# Patient Record
Sex: Male | Born: 1960 | Race: Black or African American | Hispanic: No | Smoking: Current every day smoker
Health system: Southern US, Community
[De-identification: ages and names within clinical notes are randomized; demographics above are authoritative.]

## PROBLEM LIST (undated history)

## (undated) DIAGNOSIS — M1990 Unspecified osteoarthritis, unspecified site: Secondary | ICD-10-CM

## (undated) HISTORY — PX: SPINE SURGERY: SHX786

## (undated) HISTORY — DX: Unspecified osteoarthritis, unspecified site: M19.90

---

## 2002-10-06 ENCOUNTER — Encounter: Payer: Self-pay | Admitting: Emergency Medicine

## 2002-10-06 ENCOUNTER — Inpatient Hospital Stay (HOSPITAL_COMMUNITY): Admission: EM | Admit: 2002-10-06 | Discharge: 2002-10-08 | Payer: Self-pay | Admitting: Emergency Medicine

## 2002-10-10 ENCOUNTER — Encounter (HOSPITAL_BASED_OUTPATIENT_CLINIC_OR_DEPARTMENT_OTHER): Admission: RE | Admit: 2002-10-10 | Discharge: 2002-11-05 | Payer: Self-pay | Admitting: *Deleted

## 2003-04-21 ENCOUNTER — Encounter: Payer: Self-pay | Admitting: Emergency Medicine

## 2003-04-21 ENCOUNTER — Inpatient Hospital Stay (HOSPITAL_COMMUNITY): Admission: AC | Admit: 2003-04-21 | Discharge: 2003-05-10 | Payer: Self-pay

## 2003-05-09 ENCOUNTER — Encounter: Payer: Self-pay | Admitting: Surgery

## 2003-05-10 ENCOUNTER — Inpatient Hospital Stay (HOSPITAL_COMMUNITY)
Admission: RE | Admit: 2003-05-10 | Discharge: 2003-05-24 | Payer: Self-pay | Admitting: Physical Medicine & Rehabilitation

## 2003-05-10 ENCOUNTER — Encounter: Payer: Self-pay | Admitting: General Surgery

## 2003-05-15 ENCOUNTER — Encounter: Payer: Self-pay | Admitting: Physical Medicine & Rehabilitation

## 2003-06-18 ENCOUNTER — Encounter
Admission: RE | Admit: 2003-06-18 | Discharge: 2003-09-16 | Payer: Self-pay | Admitting: Physical Medicine & Rehabilitation

## 2003-06-28 ENCOUNTER — Ambulatory Visit (HOSPITAL_COMMUNITY)
Admission: RE | Admit: 2003-06-28 | Discharge: 2003-06-28 | Payer: Self-pay | Admitting: Physical Medicine & Rehabilitation

## 2003-09-03 ENCOUNTER — Encounter
Admission: RE | Admit: 2003-09-03 | Discharge: 2003-11-19 | Payer: Self-pay | Admitting: Physical Medicine & Rehabilitation

## 2003-09-27 ENCOUNTER — Encounter
Admission: RE | Admit: 2003-09-27 | Discharge: 2003-12-26 | Payer: Self-pay | Admitting: Physical Medicine & Rehabilitation

## 2007-05-26 ENCOUNTER — Encounter (INDEPENDENT_AMBULATORY_CARE_PROVIDER_SITE_OTHER): Payer: Self-pay | Admitting: Nurse Practitioner

## 2007-05-26 ENCOUNTER — Ambulatory Visit: Payer: Self-pay | Admitting: Internal Medicine

## 2007-05-26 LAB — CONVERTED CEMR LAB
ALT: 19 units/L (ref 0–53)
AST: 16 units/L (ref 0–37)
Albumin: 4.5 g/dL (ref 3.5–5.2)
Alkaline Phosphatase: 59 units/L (ref 39–117)
BUN: 12 mg/dL (ref 6–23)
Basophils Absolute: 0 10*3/uL (ref 0.0–0.1)
Basophils Relative: 0 % (ref 0–1)
CO2: 25 meq/L (ref 19–32)
Calcium: 9.9 mg/dL (ref 8.4–10.5)
Chloride: 105 meq/L (ref 96–112)
Creatinine, Ser: 1.21 mg/dL (ref 0.40–1.50)
Eosinophils Absolute: 0 10*3/uL (ref 0.0–0.7)
Eosinophils Relative: 0 % (ref 0–5)
Glucose, Bld: 80 mg/dL (ref 70–99)
HCT: 48.6 % (ref 39.0–52.0)
Hemoglobin: 15.2 g/dL (ref 13.0–17.0)
Lymphocytes Relative: 19 % (ref 12–46)
Lymphs Abs: 1.9 10*3/uL (ref 0.7–3.3)
MCHC: 31.3 g/dL (ref 30.0–36.0)
MCV: 94.2 fL (ref 78.0–100.0)
Monocytes Absolute: 0.6 10*3/uL (ref 0.2–0.7)
Monocytes Relative: 6 % (ref 3–11)
Neutro Abs: 7.5 10*3/uL (ref 1.7–7.7)
Neutrophils Relative %: 75 % (ref 43–77)
PSA: 1.38 ng/mL (ref 0.10–4.00)
Platelets: 339 10*3/uL (ref 150–400)
Potassium: 4.5 meq/L (ref 3.5–5.3)
RBC: 5.16 M/uL (ref 4.22–5.81)
RDW: 14.7 % — ABNORMAL HIGH (ref 11.5–14.0)
Sodium: 143 meq/L (ref 135–145)
TSH: 1.455 microintl units/mL (ref 0.350–5.50)
Total Bilirubin: 0.6 mg/dL (ref 0.3–1.2)
Total Protein: 7.6 g/dL (ref 6.0–8.3)
WBC: 10 10*3/uL (ref 4.0–10.5)

## 2007-05-29 ENCOUNTER — Encounter (INDEPENDENT_AMBULATORY_CARE_PROVIDER_SITE_OTHER): Payer: Self-pay | Admitting: Nurse Practitioner

## 2007-05-29 ENCOUNTER — Ambulatory Visit: Payer: Self-pay | Admitting: Family Medicine

## 2007-05-29 LAB — CONVERTED CEMR LAB
Cholesterol: 244 mg/dL — ABNORMAL HIGH (ref 0–200)
HDL: 57 mg/dL (ref >39)
LDL Cholesterol: 165 mg/dL — ABNORMAL HIGH (ref 0–99)
Total CHOL/HDL Ratio: 4.3
Triglycerides: 111 mg/dL (ref <150)
VLDL: 22 mg/dL (ref 0–40)

## 2007-05-30 ENCOUNTER — Ambulatory Visit (HOSPITAL_COMMUNITY): Admission: RE | Admit: 2007-05-30 | Discharge: 2007-05-30 | Payer: Self-pay | Admitting: Family Medicine

## 2008-04-09 ENCOUNTER — Ambulatory Visit: Payer: Self-pay | Admitting: Family Medicine

## 2008-04-12 ENCOUNTER — Ambulatory Visit: Payer: Self-pay | Admitting: *Deleted
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
## 2016-02-19 ENCOUNTER — Encounter: Payer: Self-pay | Admitting: Occupational Therapy

## 2016-02-19 ENCOUNTER — Ambulatory Visit: Payer: Non-veteran care | Attending: Internal Medicine | Admitting: Occupational Therapy

## 2016-02-19 DIAGNOSIS — S069XAA Unspecified intracranial injury with loss of consciousness status unknown, initial encounter: Secondary | ICD-10-CM | POA: Insufficient documentation

## 2016-02-19 DIAGNOSIS — M25641 Stiffness of right hand, not elsewhere classified: Secondary | ICD-10-CM | POA: Diagnosis present

## 2016-02-19 DIAGNOSIS — M6281 Muscle weakness (generalized): Secondary | ICD-10-CM | POA: Insufficient documentation

## 2016-02-19 DIAGNOSIS — R600 Localized edema: Secondary | ICD-10-CM | POA: Diagnosis present

## 2016-02-19 DIAGNOSIS — M25541 Pain in joints of right hand: Secondary | ICD-10-CM | POA: Insufficient documentation

## 2016-02-19 DIAGNOSIS — M1990 Unspecified osteoarthritis, unspecified site: Secondary | ICD-10-CM | POA: Insufficient documentation

## 2016-02-19 DIAGNOSIS — S069X9A Unspecified intracranial injury with loss of consciousness of unspecified duration, initial encounter: Secondary | ICD-10-CM | POA: Insufficient documentation

## 2016-02-19 NOTE — Therapy (Signed)
[facility] [address] [place], [place], [address] Phone: [phone]   Fax:  [phone]  Occupational Therapy Evaluation  Patient Details  Name: Jesse Galloway MRN: [id number] Date of Birth: May 25, 1961 Referring Provider: Dr. Glenford Peers  Encounter Date: 02/19/2016      OT End of Session - 02/19/16 1140    Visit Number 1   Number of Visits 13   Date for OT Re-Evaluation 04/02/16   Authorization Type VA   Authorization Time Period APPROVED 14 VISITS THRU 04/24/16   Authorization - Visit Number 1   Authorization - Number of Visits 14   OT Start Time 1015   OT Stop Time 1100   OT Time Calculation (min) 45 min   Activity Tolerance Patient tolerated treatment well      Past Medical History:  Diagnosis Date  . Arthritis     Past Surgical History:  Procedure Laterality Date  . SPINE SURGERY      There were no vitals filed for this visit.      Subjective Assessment - 02/19/16 1023    Subjective  It's stiff and painful (re: Rt long finger)    Pertinent History Recent surgery to Rt long finger 11/17/15, PMH: OA, TBI, back surgery for tumor removal    Limitations finger healed, no restrictions at this time (wears finger extension splint for pm)   Patient Stated Goals I want to get my finger straighter and less painful   Currently in Pain? Yes   Pain Score 5    Pain Location Finger (Comment which one)  long   Pain Orientation Right   Pain Descriptors / Indicators Throbbing   Pain Type Chronic pain   Pain Onset More than a month ago   Pain Frequency Intermittent   Aggravating Factors  early am   Pain Relieving Factors pain meds, exercise, heat           [facility] OT Assessment - 02/19/16 0001      Assessment   Diagnosis flexor tendon injury (over 30 yrs ago), s/p capsulectomy and contracture release Rt long finger   Referring Provider Dr. Glenford Peers   Onset Date 11/17/15  surgery date  (original injury > 30 yrs ago)   Prior Therapy 2 visits of OT at [facility] hospital      Precautions   Precautions None   Required Braces or Orthoses Other Brace/Splint   Other Brace/Splint Pt has PM finger splint for PIP extension (made at [facility])     Restrictions   Weight Bearing Restrictions No     Balance Screen   Has the patient fallen in the past 6 months Yes   How many times? 2   Has the patient had a decrease in activity level because of a fear of falling?  No   Is the patient reluctant to leave their home because of a fear of falling?  No     Home  Environment   Bathroom Shower/Tub Walk-in Shower;Curtain  built in seat    Additional Comments Pt lives in 3 story home (counting basement level) but pt lives on main floor. Goes to basement 1x/wk for laundry. Pt has 2 steps to enter home. 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   ADL comments Pt does not drive     IADL   Shopping Assistance for transportation   Light Housekeeping Does personal laundry completely;Performs light daily tasks such as dishwashing, bed making   Meal Prep Plans, prepares and serves adequate meals independently   Medication Management Has difficulty remembering to take medication   Financial Management --  Brother pays all bills     Mobility   Mobility Status Comments uses [facility]     Written Expression   Dominant Hand Right   Handwriting 100% legible  In print, no changes in signature     Vision - History   Baseline Vision  Wears glasses all the time     Cognition   Overall Cognitive Status History of cognitive impairments - at baseline  TBI approx 8-10 years ago     Observation/Other Assessments   Observations incision healed     Sensation   Light Touch Impaired Detail   Additional Comments numb along incision volarly Rt long finger     Coordination   9 Hole Peg Test Right;Left   Right 9 Hole Peg Test 24.97 sec   Left 9 Hole Peg Test 22.06 sec     Edema   Edema moderate Rt long finger and at PIP joint     ROM / Strength   AROM / PROM / Strength AROM     AROM   Overall AROM Comments BUE AROM WNL's except Rt long finger PIP joint. All other digits including thumb WFL's.      Right Hand AROM   R Long  MCP 0-90 80 Degrees  0 ext   R Long PIP 0-100 95 Degrees  extensor lag -50*     Hand Function   Right Hand Grip (lbs) 58 lbs   Left Hand Grip (lbs) 90 lbs                           OT Short Term Goals - 02/19/16 1202      OT SHORT TERM GOAL #1   Title Independent with HEP (Due 03/12/16)   Time 3   Period Weeks   Status New     OT SHORT TERM GOAL #2   Title Pain less than or equal to 4/10 with daily activities   Baseline 5-8/10   Time 3   Period Weeks   Status New     OT SHORT TERM GOAL #3   Title Rt long finger extensor lag to improve by 10 degrees or >   Baseline -50*   Time 3   Period Weeks   Status New           OT Long Term Goals - 02/19/16 1203      OT LONG TERM GOAL #1   Title Independent with edema reduction techniques and splinting needs prn (due 04/02/16)   Time 6   Period Weeks   Status New     OT LONG TERM GOAL #2   Title Grip strength Rt hand to be 65 lbs or greater to assist with opening jars/containers   Baseline 58 lbs   Time 6   Period Weeks  Status New     OT LONG TERM GOAL #3   Title Rt long finger extensor lag to improve by 20 degrees    Baseline -50   Time 6   Period Weeks   Status New               Plan -  March 17, 2016 1150    Clinical Impression Statement Pt is a 55 y.o. male who presents to outpatient rehab s/p capsulectomy and contracture release of Rt long finger on 11/17/15 from flexor tendon injury over 30 yrs ago. Pt presents now with significant PIP extensor lag, joint/finger swelling, and pain which limits ability to perform ADLS efficiently.    Rehab Potential Fair   Clinical Impairments Affecting Rehab Potential time since original injury and time since surgery   OT Frequency 2x / week   OT Duration 6 weeks  plus evaluation   OT Treatment/Interventions Self-care/ADL training;Moist Heat;Fluidtherapy;DME and/or AE instruction;Splinting;Patient/family education;Compression bandaging;Therapeutic exercises;Scar mobilization;Therapeutic activities;Ultrasound;Cryotherapy;Passive range of motion;Electrical Stimulation;Parrafin;Manual Therapy   Plan modalities ([place], Fluido or paraffin), PROM   Consulted and Agree with Plan of Care Patient      Patient will benefit from skilled therapeutic intervention in order to improve the following deficits and impairments:  Decreased coordination, Decreased range of motion, Impaired flexibility, Increased edema, Impaired sensation, Decreased skin integrity, Impaired UE functional use, Pain, Decreased scar mobility, Decreased strength  Visit Diagnosis: Pain in joint of right hand - Plan: Ot plan of care cert/re-cert  Localized edema - Plan: Ot plan of care cert/re-cert  Stiffness of joint, hand, right - Plan: Ot plan of care cert/re-cert  Muscle weakness (generalized) - Plan: Ot plan of care cert/re-cert      G-Codes - 2016/03/17 1204-12-19    Functional Assessment Tool Used Rt long finger mod to max edema, pain 5-8/10, extensor lag PIP joint -50*   Functional Limitation Changing and maintaining body position   Changing and Maintaining Body Position Current Status (E4540) At least 40 percent but less than 60 percent impaired, limited or restricted   Changing and  Maintaining Body Position Goal Status (J8119) At least 20 percent but less than 40 percent impaired, limited or restricted      Problem List Patient Active Problem List   Diagnosis Date Noted  . Osteoarthritis 03-17-16  . TBI (traumatic brain injury) (HCC) 2016-03-17    Kelli Churn, OTR/L 2016-03-17, 12:09 PM  [facility] [address] [place], [place], [address] Phone: [phone]   Fax:  [phone]  Name: Bharath Bernstein MRN: [id number] Date of Birth: November 15, 1960

## 2016-03-02 ENCOUNTER — Ambulatory Visit: Payer: Non-veteran care | Attending: Internal Medicine | Admitting: Occupational Therapy

## 2016-03-02 DIAGNOSIS — M25541 Pain in joints of right hand: Secondary | ICD-10-CM | POA: Insufficient documentation

## 2016-03-02 DIAGNOSIS — M25641 Stiffness of right hand, not elsewhere classified: Secondary | ICD-10-CM | POA: Diagnosis present

## 2016-03-02 DIAGNOSIS — R600 Localized edema: Secondary | ICD-10-CM | POA: Diagnosis present

## 2016-03-02 DIAGNOSIS — M6281 Muscle weakness (generalized): Secondary | ICD-10-CM | POA: Insufficient documentation

## 2016-03-02 NOTE — Therapy (Signed)
[facility] [address] [place], [place], [address] Phone: [phone]   Fax:  [phone]  Occupational Therapy Treatment  Patient Details  Name: Jesse Galloway MRN: [id number] Date of Birth: 05/02/1961 Referring Provider: Dr. Glenford Peers  Encounter Date: 03/02/2016      OT End of Session - 03/02/16 0940    Visit Number 2   Number of Visits 13   Date for OT Re-Evaluation 04/02/16   Authorization Type VA   Authorization Time Period APPROVED 14 VISITS THRU 04/24/16   Authorization - Visit Number 2   Authorization - Number of Visits 14   OT Start Time 0835   OT Stop Time 0930   OT Time Calculation (min) 55 min   Activity Tolerance Patient tolerated treatment well      Past Medical History:  Diagnosis Date  . Arthritis     Past Surgical History:  Procedure Laterality Date  . SPINE SURGERY      There were no vitals filed for this visit.      Subjective Assessment - 03/02/16 0844    Subjective  Pt reports 8/10 this am, but has gone down to 5/10    Pertinent History Recent surgery to Rt long finger 11/17/15, PMH: OA, TBI, back surgery for tumor removal    Limitations finger healed, no restrictions at this time (wears finger extension splint for pm)   Patient Stated Goals I want to get my finger straighter and less painful   Currently in Pain? Yes   Pain Score 5    Pain Location --  long finger   Pain Orientation Right   Pain Descriptors / Indicators Throbbing   Pain Type Chronic pain   Pain Onset More than a month ago   Pain Frequency Intermittent   Aggravating Factors  early am   Pain Relieving Factors pain meds, exercise, heat                      OT Treatments/Exercises (OP) - 03/02/16 0001      Exercises   Exercises Hand     Hand Exercises   Other Hand Exercises Emphasized A/ROM in blocking and reverse blocking exercises followed by P/ROM to PIP joint Rt long finger in  extension.      Modalities   Modalities Fluidotherapy;Ultrasound     Ultrasound   Ultrasound Location volar palm/base of long finger   Ultrasound Parameters , 0.8 wts/cm2, 50% pulsed x 8 mintues   Ultrasound Goals --  scar tissue management     RUE Fluidotherapy   Number Minutes Fluidotherapy 10 Minutes   RUE Fluidotherapy Location Hand;Wrist   Comments at beginning of session to decr. stiffness/pain     Manual Therapy   Manual Therapy Passive ROM   Manual therapy comments Reviewed scar massage and how to perform. Reviewed desensitization techniques. Scar extractor along incisions to break up scar tissue   Passive ROM passive stretching to PIP joint Rt long finger in extension                OT Education - 03/02/16 0926    Education provided Yes   Education Details scar massage, HEP   Person(s) Educated Patient   Methods Explanation;Demonstration;Handout   Comprehension Verbalized understanding;Returned demonstration          OT Short Term Goals - 02/19/16 1202      OT SHORT TERM GOAL #1   Title Independent with HEP (Due 03/12/16)  Time 3   Period Weeks   Status New     OT SHORT TERM GOAL #2   Title Pain less than or equal to 4/10 with daily activities   Baseline 5-8/10   Time 3   Period Weeks   Status New     OT SHORT TERM GOAL #3   Title Rt long finger extensor lag to improve by 10 degrees or >   Baseline -50*   Time 3   Period Weeks   Status New           OT Long Term Goals - 02/19/16 1203      OT LONG TERM GOAL #1   Title Independent with edema reduction techniques and splinting needs prn (due 04/02/16)   Time 6   Period Weeks   Status New     OT LONG TERM GOAL #2   Title Grip strength Rt hand to be 65 lbs or greater to assist with opening jars/containers   Baseline 58 lbs   Time 6   Period Weeks   Status New     OT LONG TERM GOAL #3   Title Rt long finger extensor lag to improve by 20 degrees    Baseline -50   Time 6    Period Weeks   Status New               Plan - 03/02/16 0941    Rehab Potential Fair   Clinical Impairments Affecting Rehab Potential time since original injury and time since surgery   OT Frequency 2x / week   OT Duration 6 weeks   OT Treatment/Interventions Self-care/ADL training;Moist Heat;Fluidtherapy;DME and/or AE instruction;Splinting;Patient/family education;Compression bandaging;Therapeutic exercises;Scar mobilization;Therapeutic activities;Ultrasound;Cryotherapy;Passive range of motion;Electrical Stimulation;Parrafin;Manual Therapy   Plan continue US, adjust pm splint for greater extension prn, assess dynamic finger extension splint to see if still appropriate for on/off daytime use, review ex's and passive PIP ext (next week - issue putty)   Consulted and Agree with Plan of Care Patient      Patient will benefit from skilled therapeutic intervention in order to improve the following deficits and impairments:  Decreased coordination, Decreased range of motion, Impaired flexibility, Increased edema, Impaired sensation, Decreased skin integrity, Impaired UE functional use, Pain, Decreased scar mobility, Decreased strength  Visit Diagnosis: Pain in joint of right hand  Localized edema  Stiffness of joint, hand, right    Problem List Patient Active Problem List   Diagnosis Date Noted  . Osteoarthritis 02/19/2016  . TBI (traumatic brain injury) (HCC) 02/19/2016    Kelli ChurnBallie, Dontrae Morini Johnson, OTR/L 03/02/2016, 9:47 AM  [facility] [address] [place], [place], [address] Phone: [phone]   Fax:  [phone]  Name: Jesse Galloway MRN: [id number] Date of Birth: 01/28/1961

## 2016-03-02 NOTE — Patient Instructions (Signed)
ORDER OF ACTIVITY:    1) Paraffin - 2-3x/day 2) Scar massage - deep circles along incision 2x/day for 5 minutes (use Vitamin E cream or Cocoa butter)  3) Then do below exercises (but also do exercises 6-8/xday)       Pinch bottom knuckle of __LONG______ finger of right hand to prevent bending. Actively bend middle knuckle until stretch is felt. Hold _5___ seconds. Relax. Straighten finger as far as possible. Repeat _15__ times per set.  Do __6-8__ sessions per day.  PIP Extension (Active Controlled With Wrist and MP Flexion)    Using other hand to hold wrist at NEUTRAL (do NOT bend wrist as shown in picture) and big knuckles at __45__ flexion, straighten end joints of fingers. Repeat _15___ times. Do _6-8___ sessions per day.  PIP Extension (Passive    Use thumb of other hand on top of joint and two fingers under- neath on either side to straighten middle joint of __long____ finger. Hold _20__ seconds. Repeat _5___ times. Do _6-8___ sessions per day.

## 2016-03-03 ENCOUNTER — Ambulatory Visit: Payer: Non-veteran care | Admitting: Occupational Therapy

## 2016-03-03 DIAGNOSIS — R600 Localized edema: Secondary | ICD-10-CM

## 2016-03-03 DIAGNOSIS — M6281 Muscle weakness (generalized): Secondary | ICD-10-CM

## 2016-03-03 DIAGNOSIS — M25541 Pain in joints of right hand: Secondary | ICD-10-CM

## 2016-03-03 DIAGNOSIS — M25641 Stiffness of right hand, not elsewhere classified: Secondary | ICD-10-CM

## 2016-03-03 NOTE — Therapy (Signed)
[facility] [address] [place], [place], [address] Phone: [phone]   Fax:  [phone]  Occupational Therapy Treatment  Patient Details  Name: Jesse Galloway MRN: [id number] Date of Birth: [birth year] Referring Provider: Dr. Glenford Peers  Encounter Date: 03/03/2016      OT End of Session - 03/04/16 1729    Visit Number 3   Number of Visits 13   Date for OT Re-Evaluation 04/02/16   Authorization Type VA   Authorization Time Period APPROVED 14 VISITS THRU 04/24/16   Authorization - Visit Number 3   Authorization - Number of Visits 14   OT Start Time 1447   OT Stop Time 1530   OT Time Calculation (min) 43 min   Activity Tolerance Patient tolerated treatment well      Past Medical History:  Diagnosis Date  . Arthritis     Past Surgical History:  Procedure Laterality Date  . SPINE SURGERY      There were no vitals filed for this visit.      Subjective Assessment - 03/04/16 1727    Pertinent History Recent surgery to Rt long finger 11/17/15, PMH: OA, TBI, back surgery for tumor removal    Limitations finger healed, no restrictions at this time (wears finger extension splint for pm)   Patient Stated Goals I want to get my finger straighter and less painful   Currently in Pain? Yes   Pain Score 5    Pain Location --  finger   Pain Orientation Right   Pain Descriptors / Indicators Aching   Pain Type Chronic pain   Pain Onset More than a month ago   Pain Frequency Intermittent   Aggravating Factors  mornings, exercise   Pain Relieving Factors pain meds, heat   Multiple Pain Sites No             Hand Exercises   Other Hand Exercises Emphasized A/ROM in blocking and reverse blocking exercises followed by P/ROM to PIP joint Rt long finger in extension.      Modalities   Modalities Fluidotherapy;Ultrasound     Ultrasound   Ultrasound Location volar palm/base of long finger   Ultrasound  Parameters , 0.8 wts/cm2, 50% pulsed x 8 mintues   Ultrasound Goals --  scar tissue management     RUE Fluidotherapy   Number Minutes Fluidotherapy 15 Minutes   RUE Fluidotherapy Location Hand;Wrist   Comments at beginning of session to decr. stiffness/pain     Manual Therapy   Manual Therapy Passive ROM   Manual therapy comments       Pt brought in dynamic extension splint. therapist reviewed application, wear, and care, pt returned demonstration.(Pt was instructed to wear during daytime x 10 mins at a time 2x/ day.) Pt brought in static nighttime extension splint, pt was instructed to continue to wear at night. Pt verbalized understanding.                     OT Education - 03/04/16 1739    Education provided Yes   Education Details HEP, splint wear, care and prec   Person(s) Educated Patient   Methods Explanation;Demonstration;Verbal cues;Handout   Comprehension Verbalized understanding;Returned demonstration          OT Short Term Goals - 02/19/16 1202      OT SHORT TERM GOAL #1   Title Independent with HEP (Due 03/12/16)   Time 3   Period Weeks   Status New  OT SHORT TERM GOAL #2   Title Pain less than or equal to 4/10 with daily activities   Baseline 5-8/10   Time 3   Period Weeks   Status New     OT SHORT TERM GOAL #3   Title Rt long finger extensor lag to improve by 10 degrees or >   Baseline -50*   Time 3   Period Weeks   Status New           OT Long Term Goals - 02/19/16 1203      OT LONG TERM GOAL #1   Title Independent with edema reduction techniques and splinting needs prn (due 04/02/16)   Time 6   Period Weeks   Status New     OT LONG TERM GOAL #2   Title Grip strength Rt hand to be 65 lbs or greater to assist with opening jars/containers   Baseline 58 lbs   Time 6   Period Weeks   Status New     OT LONG TERM GOAL #3   Title Rt long finger extensor lag to improve by 20 degrees    Baseline -50   Time 6    Period Weeks   Status New               Plan - 03/04/16 1727    Clinical Impression Statement Pt is progressing slowly towards goals with significant PIP extensor lag. Pt brought in previous splint and they are fitting / working well.   Rehab Potential Fair   Clinical Impairments Affecting Rehab Potential time since original injury and time since surgery   OT Frequency 2x / week   OT Duration 6 weeks   OT Treatment/Interventions Self-care/ADL training;Moist Heat;Fluidtherapy;DME and/or AE instruction;Splinting;Patient/family education;Compression bandaging;Therapeutic exercises;Scar mobilization;Therapeutic activities;Ultrasound;Cryotherapy;Passive range of motion;Electrical Stimulation;Parrafin;Manual Therapy   Plan progress HEP as able, modalities   Consulted and Agree with Plan of Care Patient      Patient will benefit from skilled therapeutic intervention in order to improve the following deficits and impairments:  Decreased coordination, Decreased range of motion, Impaired flexibility, Increased edema, Impaired sensation, Decreased skin integrity, Impaired UE functional use, Pain, Decreased scar mobility, Decreased strength  Visit Diagnosis: Pain in joint of right hand  Localized edema  Stiffness of joint, hand, right  Muscle weakness (generalized)    Problem List Patient Active Problem List   Diagnosis Date Noted  . Osteoarthritis 02/19/2016  . TBI (traumatic brain injury) (HCC) 02/19/2016    RINE,KATHRYN 03/04/2016, 5:39 PM Keene BreathKathryn Rine, OTR/L [phone] Phone: [phone] 5:39 PM 08/10/17[facility] [address] [place], [place], [address] Phone: [phone]   Fax:  [phone]  Name: Jesse Galloway MRN: [id number] Date of Birth: 05/05/1961

## 2016-03-03 NOTE — Patient Instructions (Signed)
Wear dynamic extension splint for 10 mins, 2x day, stop wearing if any problems  Wear straight splint at night time, if any problems stop wearing

## 2016-03-08 ENCOUNTER — Ambulatory Visit: Payer: Non-veteran care | Admitting: Occupational Therapy

## 2016-03-08 ENCOUNTER — Encounter: Payer: Self-pay | Admitting: *Deleted

## 2016-03-08 DIAGNOSIS — M6281 Muscle weakness (generalized): Secondary | ICD-10-CM

## 2016-03-08 DIAGNOSIS — M25541 Pain in joints of right hand: Secondary | ICD-10-CM

## 2016-03-08 DIAGNOSIS — M25641 Stiffness of right hand, not elsewhere classified: Secondary | ICD-10-CM

## 2016-03-08 NOTE — Therapy (Signed)
[facility] [address] [place], [place], [address] Phone: [phone]   Fax:  [phone]  Occupational Therapy Treatment  Patient Details  Name: Jesse Galloway MRN: [id number] Date of Birth: 07/31/1960 Referring Provider: Dr. Glenford PeersKenneth Goldberg  Encounter Date: 03/08/2016      OT End of Session - 03/08/16 1018    Visit Number 4   Number of Visits 13   Date for OT Re-Evaluation 04/02/16   Authorization Type VA   Authorization Time Period APPROVED 14 VISITS THRU 04/24/16   Authorization - Visit Number 4   Authorization - Number of Visits 13   OT Start Time 0932   OT Stop Time 1010   OT Time Calculation (min) 38 min   Activity Tolerance Patient tolerated treatment well      Past Medical History:  Diagnosis Date  . Arthritis     Past Surgical History:  Procedure Laterality Date  . SPINE SURGERY      There were no vitals filed for this visit.      Subjective Assessment - 03/08/16 0939    Subjective  Pt reports 5/10 pain this am from initial 8/10. Mostly volar base of scar pain and sensitivitiy per pt report.   Pertinent History Recent surgery to Rt long finger 11/17/15, PMH: OA, TBI, back surgery for tumor removal    Limitations finger healed, no restrictions at this time (wears finger extension splint for pm)   Patient Stated Goals I want to get my finger straighter and less painful   Currently in Pain? Yes   Pain Score 5    Pain Location Finger (Comment which one)  R LF   Pain Orientation Right   Pain Descriptors / Indicators Aching;Throbbing   Pain Type Chronic pain   Pain Onset More than a month ago   Pain Frequency Intermittent   Aggravating Factors  Mornings, exercise   Pain Relieving Factors Pain meds, heat, parrafin   Multiple Pain Sites No            [facility] OT Assessment - 03/08/16 0001      AROM   Overall AROM  Deficits   Overall AROM Comments BUE AROM WNL's except Rt long finger  PIP joint. All other digits including thumb WFL's.    AROM Assessment Site Finger  R LF PROM PIP exten = 36* vs Active=45* after exercise noted                  OT Treatments/Exercises (OP) - 03/08/16 0001      Exercises   Exercises Hand     Hand Exercises   Other Hand Exercises Performed & Emphasized A/ROM in blocking and reverse blocking exercises followed by P/ROM to PIP joint Rt long finger in extension.      Modalities   Modalities Fluidotherapy;Ultrasound     Ultrasound   Ultrasound Location Volar palm/base of Long Finger   Ultrasound Parameters 3Mhz @ 1.0 w/cm2 pulsed US   Ultrasound Goals Other (Comment)  Pain and scar mobilization     RUE Fluidotherapy   Number Minutes Fluidotherapy 10 Minutes   RUE Fluidotherapy Location Hand;Wrist   Comments Pt performed active ROM in fludio to assist with increasing AROM, decreasing pain and increasing flexibility/scar mobilization.     Splinting   Splinting Verbal review of splint use - pt denies having any questions/concerns and reports that they are well fitting.  He alternates split use.     Manual Therapy   Manual  Therapy Passive ROM   Manual therapy comments Reviewed scar massage and how to perform. Reviewed desensitization techniques   Passive ROM passive stretching to PIP joint Rt long finger in extension                OT Education - 03/08/16 1017    Education provided Yes   Education Details Splint wear, car and precautions; HEP   Person(s) Educated Patient   Methods Explanation;Demonstration;Verbal cues   Comprehension Verbalized understanding;Returned demonstration          OT Short Term Goals - 02/19/16 1202      OT SHORT TERM GOAL #1   Title Independent with HEP (Due 03/12/16)   Time 3   Period Weeks   Status New     OT SHORT TERM GOAL #2   Title Pain less than or equal to 4/10 with daily activities   Baseline 5-8/10   Time 3   Period Weeks   Status New     OT SHORT TERM GOAL  #3   Title Rt long finger extensor lag to improve by 10 degrees or >   Baseline -50*   Time 3   Period Weeks   Status New           OT Long Term Goals - 02/19/16 1203      OT LONG TERM GOAL #1   Title Independent with edema reduction techniques and splinting needs prn (due 04/02/16)   Time 6   Period Weeks   Status New     OT LONG TERM GOAL #2   Title Grip strength Rt hand to be 65 lbs or greater to assist with opening jars/containers   Baseline 58 lbs   Time 6   Period Weeks   Status New     OT LONG TERM GOAL #3   Title Rt long finger extensor lag to improve by 20 degrees    Baseline -50   Time 6   Period Weeks   Status New               Plan - 03/08/16 1021    Clinical Impression Statement Pt cont to make slow gains toward goals as he is is with noted PIP extension lag/contracture. Pt should benefit from HEP and techniques as discussed and performed today in clinic to assist with increasing ROM and joint flexibility R LF PIPJ.   Rehab Potential Fair   Clinical Impairments Affecting Rehab Potential time since original injury and time since surgery   OT Frequency 2x / week   OT Duration 6 weeks   OT Treatment/Interventions Self-care/ADL training;Moist Heat;Fluidtherapy;DME and/or AE instruction;Splinting;Patient/family education;Compression bandaging;Therapeutic exercises;Scar mobilization;Therapeutic activities;Ultrasound;Cryotherapy;Passive range of motion;Electrical Stimulation;Parrafin;Manual Therapy   Plan Progress HEP (Consider putty ex's for extension R LF next 1-2 visits); Modalities   Consulted and Agree with Plan of Care Patient      Patient will benefit from skilled therapeutic intervention in order to improve the following deficits and impairments:  Decreased coordination, Decreased range of motion, Impaired flexibility, Increased edema, Impaired sensation, Decreased skin integrity, Impaired UE functional use, Pain, Decreased scar mobility, Decreased  strength  Visit Diagnosis: Pain in joint of right hand  Stiffness of joint, hand, right  Muscle weakness (generalized)    Problem List Patient Active Problem List   Diagnosis Date Noted  . Osteoarthritis 02/19/2016  . TBI (traumatic brain injury) (HCC) 02/19/2016    Rowe Warman Beth Dixon, OTR/L 03/08/2016, 10:29 AM  [facility] [address] [place], [place], [address] Phone: [phone]   Fax:  [phone]  Name: Jesse Galloway MRN: [id number] Date of Birth: 03/31/1961

## 2016-03-10 ENCOUNTER — Ambulatory Visit: Payer: Non-veteran care | Admitting: Occupational Therapy

## 2016-03-10 DIAGNOSIS — M25541 Pain in joints of right hand: Secondary | ICD-10-CM | POA: Diagnosis not present

## 2016-03-10 DIAGNOSIS — R600 Localized edema: Secondary | ICD-10-CM

## 2016-03-10 DIAGNOSIS — M6281 Muscle weakness (generalized): Secondary | ICD-10-CM

## 2016-03-10 DIAGNOSIS — M25641 Stiffness of right hand, not elsewhere classified: Secondary | ICD-10-CM

## 2016-03-10 NOTE — Therapy (Signed)
[facility] [address] [place], [place], [address] Phone: [phone]   Fax:  [phone]  Occupational Therapy Treatment  Patient Details  Name: Jesse RangerCharles Galloway MRN: [id number] Date of Birth: 06/06/1961 Referring Provider: Dr. Glenford PeersKenneth Goldberg  Encounter Date: 03/10/2016      OT End of Session - 03/10/16 1720    Visit Number 5   Number of Visits 13   Date for OT Re-Evaluation 04/02/16   Authorization Type VA   Authorization Time Period APPROVED 14 VISITS THRU 04/24/16   Authorization - Visit Number 4   Authorization - Number of Visits 13   OT Start Time 1617   OT Stop Time 1700   OT Time Calculation (min) 43 min   Activity Tolerance Patient tolerated treatment well   Behavior During Therapy [facility] for tasks assessed/performed      Past Medical History:  Diagnosis Date  . Arthritis     Past Surgical History:  Procedure Laterality Date  . SPINE SURGERY      There were no vitals filed for this visit.      Subjective Assessment - 03/10/16 1715    Subjective  Pt reports pain after wearing dynamic ext. splint   Pertinent History Recent surgery to Rt long finger 11/17/15, PMH: OA, TBI, back surgery for tumor removal    Limitations finger healed, no restrictions at this time (wears finger extension splint for pm)   Patient Stated Goals I want to get my finger straighter and less painful   Currently in Pain? Yes   Pain Score 4    Pain Location Finger (Comment which one)   Pain Orientation Right   Pain Type Chronic pain   Pain Onset More than a month ago   Pain Frequency Intermittent   Aggravating Factors  exercise, stretching of dynamic ext splint   Pain Relieving Factors heat   Multiple Pain Sites No           Treatment:  Hand Exercises   Other Hand Exercises P/ROM, AA/ROM  to PIP joint Rt long finger in extension. Pt was instructed in red putty HEP, he returned demonstration.     Modalities   Modalities Fluidotherapy;Ultrasound     Ultrasound   Ultrasound Location volar palm/base of long finger   Ultrasound Parameters 3Mhz, 0.8 wts/cm2, 20% pulsed x 8 mintues   Ultrasound Goals --  scar tissue management     RUE Fluidotherapy   Number Minutes Fluidotherapy 13 Minutes   RUE Fluidotherapy Location Hand;Wrist   Comments at beginning of session to decr. stiffness/pain     Manual Therapy   Manual Therapy Scar massage and extractor avoiding closed blister that pt states he rubbed on palm during scar massage. Pt was cautioned to use lotion and avoid rubbing too hard.   Manual therapy comments                       OT Education - 03/10/16 1714    Education provided Yes   Education Details Putty exercises, red-importance of not rubbing too hard for scar massage(pt developed a closed blister)   Person(s) Educated Patient   Methods Explanation;Demonstration;Verbal cues;Handout   Comprehension Verbalized understanding;Returned demonstration;Verbal cues required          OT Short Term Goals - 03/10/16 1719      OT SHORT TERM GOAL #1   Title Independent with HEP (Due 03/12/16)   Time 3   Period Weeks   Status  Achieved     OT SHORT TERM GOAL #2   Title Pain less than or equal to 4/10 with daily activities   Time 3   Period Weeks   Status On-going     OT SHORT TERM GOAL #3   Title Rt long finger extensor lag to improve by 10 degrees or >   Time 3   Period Weeks   Status On-going           OT Long Term Goals - 02/19/16 1203      OT LONG TERM GOAL #1   Title Independent with edema reduction techniques and splinting needs prn (due 04/02/16)   Time 6   Period Weeks   Status New     OT LONG TERM GOAL #2   Title Grip strength Rt hand to be 65 lbs or greater to assist with opening jars/containers   Baseline 58 lbs   Time 6   Period Weeks   Status New     OT LONG TERM GOAL #3   Title Rt long finger extensor lag to improve by 20 degrees     Baseline -50   Time 6   Period Weeks   Status New               Plan - 03/10/16 1717    Clinical Impression Statement Pt continues to make slow gains in RUE ROM. Pt demonstrates understanding of HEP for putty.   Rehab Potential Fair   Clinical Impairments Affecting Rehab Potential time since original injury and time since surgery   OT Frequency 2x / week   OT Duration 6 weeks   OT Treatment/Interventions Self-care/ADL training;Moist Heat;Fluidtherapy;DME and/or AE instruction;Splinting;Patient/family education;Compression bandaging;Therapeutic exercises;Scar mobilization;Therapeutic activities;Ultrasound;Cryotherapy;Passive range of motion;Electrical Stimulation;Parrafin;Manual Therapy   Plan reinforce putty ex, modalities   Consulted and Agree with Plan of Care Patient      Patient will benefit from skilled therapeutic intervention in order to improve the following deficits and impairments:  Decreased coordination, Decreased range of motion, Impaired flexibility, Increased edema, Impaired sensation, Decreased skin integrity, Impaired UE functional use, Pain, Decreased scar mobility, Decreased strength  Visit Diagnosis: Pain in joint of right hand  Stiffness of joint, hand, right  Muscle weakness (generalized)  Localized edema    Problem List Patient Active Problem List   Diagnosis Date Noted  . Osteoarthritis 02/19/2016  . TBI (traumatic brain injury) (HCC) 02/19/2016    Galloway,Jesse 03/10/2016, 5:21 PM Keene BreathKathryn Galloway, OTR/L [phone] Phone: [phone] 5:21 PM 03/10/16 [facility] [address] [place], [place], [address] Phone: [phone]   Fax:  [phone]  Name: Jesse RangerCharles Galloway MRN: [id number] Date of Birth: 12/28/1960

## 2016-03-10 NOTE — Patient Instructions (Signed)
1. Grip Strengthening (Resistive Putty)   Squeeze putty using thumb and all fingers. Repeat _20___ times. Do __2__ sessions per day.   2. Roll putty into tube on table and pinch between each finger and thumb x 10 reps each. (can do ring and small finger together)   Loop putty around tip of middle finger try to straighten tip of middle finger 5-10 reps 2x day  Copyright  V[employer]. All rights reserved.

## 2016-03-16 ENCOUNTER — Ambulatory Visit: Payer: Non-veteran care | Admitting: Occupational Therapy

## 2016-03-16 DIAGNOSIS — M25541 Pain in joints of right hand: Secondary | ICD-10-CM

## 2016-03-16 DIAGNOSIS — M6281 Muscle weakness (generalized): Secondary | ICD-10-CM

## 2016-03-16 DIAGNOSIS — M25641 Stiffness of right hand, not elsewhere classified: Secondary | ICD-10-CM

## 2016-03-16 NOTE — Therapy (Signed)
[facility] [address] [place], [place], [address] Phone: [phone]   Fax:  [phone]  Occupational Therapy Treatment  Patient Details  Name: Jesse Galloway MRN: [id number] Date of Birth: 12/26/1960 Referring Provider: Dr. Glenford PeersKenneth Goldberg  Encounter Date: 03/16/2016      OT End of Session - 03/16/16 1118    Visit Number 6   Number of Visits 13   Date for OT Re-Evaluation 04/02/16   Authorization Type VA   Authorization Time Period APPROVED 14 VISITS THRU 04/24/16   Authorization - Visit Number 5   Authorization - Number of Visits 13   OT Start Time 0933   OT Stop Time 1015   OT Time Calculation (min) 42 min   Equipment Utilized During Treatment FLUIDO, PARAFFIN   Activity Tolerance Patient tolerated treatment well      Past Medical History:  Diagnosis Date  . Arthritis     Past Surgical History:  Procedure Laterality Date  . SPINE SURGERY      There were no vitals filed for this visit.      Subjective Assessment - 03/16/16 0942    Subjective  This morning the pain was 8/10, but then I took my pain meds and it was 5/10   Pertinent History Recent surgery to Rt long finger 11/17/15, PMH: OA, TBI, back surgery for tumor removal    Limitations finger healed, no restrictions at this time (wears finger extension splint for pm)   Patient Stated Goals I want to get my finger straighter and less painful   Currently in Pain? Yes   Pain Score 5    Pain Location Finger (Comment which one)  long   Pain Orientation Right   Pain Descriptors / Indicators Aching;Throbbing   Pain Type Chronic pain   Pain Onset More than a month ago   Pain Frequency Intermittent   Aggravating Factors  exercise, stretching of dynamic ext splint   Pain Relieving Factors heat                      OT Treatments/Exercises (OP) - 03/16/16 0001      Hand Exercises   Other Hand Exercises Pt issued tendong gliding  ex's - pt return demo      Ultrasound   Ultrasound Location volar palm/base of finger   Ultrasound Parameters 3 Mhz, 0.8 wts/cm2, 20% pulsed, x 8 minutes   Ultrasound Goals --  scar tissue      RUE Fluidotherapy   Number Minutes Fluidotherapy 12 Minutes   RUE Fluidotherapy Location Hand;Wrist   Comments for stiffness/pain     Manual Therapy   Manual therapy comments scar extractor for scar management over incisions   Passive ROM passive stretching to PIP joint Rt long finger in extension and flexion                OT Education - 03/16/16 1018    Education provided Yes   Education Details tendon gliding exercises   Person(s) Educated Patient   Methods Explanation;Demonstration   Comprehension Verbalized understanding;Returned demonstration          OT Short Term Goals - 03/10/16 1719      OT SHORT TERM GOAL #1   Title Independent with HEP (Due 03/12/16)   Time 3   Period Weeks   Status Achieved     OT SHORT TERM GOAL #2   Title Pain less than or equal to 4/10 with daily activities  Time 3   Period Weeks   Status On-going     OT SHORT TERM GOAL #3   Title Rt long finger extensor lag to improve by 10 degrees or >   Time 3   Period Weeks   Status On-going           OT Long Term Goals - 02/19/16 1203      OT LONG TERM GOAL #1   Title Independent with edema reduction techniques and splinting needs prn (due 04/02/16)   Time 6   Period Weeks   Status New     OT LONG TERM GOAL #2   Title Grip strength Rt hand to be 65 lbs or greater to assist with opening jars/containers   Baseline 58 lbs   Time 6   Period Weeks   Status New     OT LONG TERM GOAL #3   Title Rt long finger extensor lag to improve by 20 degrees    Baseline -50   Time 6   Period Weeks   Status New               Plan - 03/16/16 1120    Clinical Impression Statement Pt with slightly increased passive extension Rt long PIP joint after modalities, however bony contracture  prevents full extension   Rehab Potential Fair   Clinical Impairments Affecting Rehab Potential time since original injury and time since surgery   OT Frequency 2x / week   OT Duration 6 weeks   OT Treatment/Interventions Self-care/ADL training;Moist Heat;Fluidtherapy;DME and/or AE instruction;Splinting;Patient/family education;Compression bandaging;Therapeutic exercises;Scar mobilization;Therapeutic activities;Ultrasound;Cryotherapy;Passive range of motion;Electrical Stimulation;Parrafin;Manual Therapy   Plan review putty ex and tendon gliding ex, modalities   Consulted and Agree with Plan of Care Patient      Patient will benefit from skilled therapeutic intervention in order to improve the following deficits and impairments:  Decreased coordination, Decreased range of motion, Impaired flexibility, Increased edema, Impaired sensation, Decreased skin integrity, Impaired UE functional use, Pain, Decreased scar mobility, Decreased strength  Visit Diagnosis: Pain in joint of right hand  Stiffness of joint, hand, right  Muscle weakness (generalized)    Problem List Patient Active Problem List   Diagnosis Date Noted  . Osteoarthritis 02/19/2016  . TBI (traumatic brain injury) (HCC) 02/19/2016    Kelli ChurnBallie, Vandy Fong Johnson, OTR/L 03/16/2016, 11:22 AM  [facility] [address] [place], [place], [address] Phone: [phone]   Fax:  [phone]  Name: Jesse Galloway MRN: [id number] Date of Birth: 10/17/1960

## 2016-03-18 ENCOUNTER — Ambulatory Visit: Payer: Non-veteran care | Admitting: Occupational Therapy

## 2016-03-18 DIAGNOSIS — M25641 Stiffness of right hand, not elsewhere classified: Secondary | ICD-10-CM

## 2016-03-18 DIAGNOSIS — M25541 Pain in joints of right hand: Secondary | ICD-10-CM

## 2016-03-18 NOTE — Therapy (Signed)
[facility] [address] [place], [place], [address] Phone: [phone]   Fax:  [phone]  Occupational Therapy Treatment  Patient Details  Name: Jesse Galloway MRN: [id number] Date of Birth: 04-May-1961 Referring Provider: Dr. Jorge Mandril  Encounter Date: 03/18/2016      OT End of Session - 03/18/16 1400    Visit Number 7   Number of Visits 13   Date for OT Re-Evaluation 04/02/16   Authorization Type VA   Authorization Time Period APPROVED 14 VISITS THRU 04/24/16   Authorization - Visit Number 6   Authorization - Number of Visits 13   OT Start Time 5974   OT Stop Time 1400   OT Time Calculation (min) 45 min   Equipment Utilized During Treatment fluido, paraffin   Activity Tolerance Patient tolerated treatment well      Past Medical History:  Diagnosis Date  . Arthritis     Past Surgical History:  Procedure Laterality Date  . SPINE SURGERY      There were no vitals filed for this visit.      Subjective Assessment - 03/18/16 1319    Subjective  This morning the pain was 8/10, but then I took my pain meds and it was 5/10   Pertinent History Recent surgery to Rt long finger 11/17/15, PMH: OA, TBI, back surgery for tumor removal    Limitations finger healed, no restrictions at this time (wears finger extension splint for pm)   Patient Stated Goals I want to get my finger straighter and less painful   Currently in Pain? Yes   Pain Score 5    Pain Location Finger (Comment which one)  long   Pain Orientation Right   Pain Descriptors / Indicators Aching;Throbbing   Pain Type Chronic pain   Pain Onset More than a month ago   Pain Frequency Intermittent   Aggravating Factors  exercise   Pain Relieving Factors heat            [facility] OT Assessment - 03/18/16 0001      Right Hand AROM   R Long PIP 0-100 95 Degrees  extensor lag -50*                  OT Treatments/Exercises (OP) -  03/18/16 0001      Hand Exercises   Other Hand Exercises Reviewed tendon gliding ex's and putty ex's from HEP's     Ultrasound   Ultrasound Location base of long finger (volarly)    Ultrasound Parameters 3 Mhz, 0.8 wts/cm2, 20% pulsed, x 8 minutes   Ultrasound Goals Other (Comment)  scar tissue     RUE Fluidotherapy   Number Minutes Fluidotherapy 12 Minutes   RUE Fluidotherapy Location Hand;Wrist   Comments at beginning of session to decr. stiffness/pain     Manual Therapy   Manual therapy comments scar extractor for scar management over incisions at proximal phalanx   Passive ROM passive stretching to PIP joint Rt long finger in extension and flexion                  OT Short Term Goals - 03/18/16 1401      OT SHORT TERM GOAL #1   Title Independent with HEP (Due 03/12/16)   Time 3   Period Weeks   Status Achieved     OT SHORT TERM GOAL #2   Title Pain less than or equal to 4/10 with daily activities   Time 3  Period Weeks   Status Not Met  5-8/10     OT SHORT TERM GOAL #3   Title Rt long finger extensor lag to improve by 10 degrees or >   Time 3   Period Weeks   Status Not Met  03/18/16: still -50*           OT Long Term Goals - 03/18/16 1402      OT LONG TERM GOAL #1   Title Independent with edema reduction techniques and splinting needs prn (due 04/02/16)   Time 6   Period Weeks   Status Achieved     OT LONG TERM GOAL #2   Title Grip strength Rt hand to be 65 lbs or greater to assist with opening jars/containers   Baseline 58 lbs   Time 6   Period Weeks   Status New     OT LONG TERM GOAL #3   Title Rt long finger extensor lag to improve by 20 degrees    Baseline -50   Time 6   Period Weeks   Status New               Plan - 03/18/16 1402    Clinical Impression Statement STG's #2 and #3 not met due to no improvements in PIP ROM Rt long finger and pain remains greater than 4/10. Pt met LTG #1.    Clinical Impairments Affecting  Rehab Potential time since original injury and time since surgery   OT Frequency 2x / week   OT Duration 6 weeks   OT Treatment/Interventions Self-care/ADL training;Moist Heat;Fluidtherapy;DME and/or AE instruction;Splinting;Patient/family education;Compression bandaging;Therapeutic exercises;Scar mobilization;Therapeutic activities;Ultrasound;Cryotherapy;Passive range of motion;Electrical Stimulation;Parrafin;Manual Therapy   Plan continue modalities, grip strenth, AA/ROM finger ext with cards. Anticipate d/c within 2 weeks if no further improvements in ROM    Consulted and Agree with Plan of Care Patient      Patient will benefit from skilled therapeutic intervention in order to improve the following deficits and impairments:  Decreased coordination, Decreased range of motion, Impaired flexibility, Increased edema, Impaired sensation, Decreased skin integrity, Impaired UE functional use, Pain, Decreased scar mobility, Decreased strength  Visit Diagnosis: Pain in joint of right hand  Stiffness of joint, hand, right    Problem List Patient Active Problem List   Diagnosis Date Noted  . Osteoarthritis 02/19/2016  . TBI (traumatic brain injury) ([place]) 02/19/2016    Carey Bullocks, OTR/L 03/18/2016, 4:26 PM  [place] [address] [place], [place], [address] Phone: [phone]   Fax:  [phone]  Name: Jesse Galloway MRN: [id number] Date of Birth: 03/10/1961

## 2016-03-23 ENCOUNTER — Ambulatory Visit: Payer: Non-veteran care | Admitting: Occupational Therapy

## 2016-03-23 DIAGNOSIS — M25541 Pain in joints of right hand: Secondary | ICD-10-CM | POA: Diagnosis not present

## 2016-03-23 DIAGNOSIS — M25641 Stiffness of right hand, not elsewhere classified: Secondary | ICD-10-CM

## 2016-03-23 DIAGNOSIS — M6281 Muscle weakness (generalized): Secondary | ICD-10-CM

## 2016-03-23 NOTE — Therapy (Signed)
[facility] [address] [place], [place], [address] Phone: [phone]   Fax:  [phone]  Occupational Therapy Treatment  Patient Details  Name: Jesse Galloway MRN: [id number] Date of Birth: 04-Jun-1961 Referring Provider: Dr. Jorge Mandril  Encounter Date: 03/23/2016      OT End of Session - 03/23/16 1015    Visit Number 8   Number of Visits 13   Date for OT Re-Evaluation 04/02/16   Authorization Type VA   Authorization Time Period APPROVED 14 VISITS THRU 04/24/16   Authorization - Visit Number 7   Authorization - Number of Visits 13   OT Start Time 0930   OT Stop Time 1015   OT Time Calculation (min) 45 min   Equipment Utilized During Treatment fluido, ultrasound   Activity Tolerance Patient tolerated treatment well      Past Medical History:  Diagnosis Date  . Arthritis     Past Surgical History:  Procedure Laterality Date  . SPINE SURGERY      There were no vitals filed for this visit.      Subjective Assessment - 03/23/16 0931    Subjective  It's a little more sore this morning - I've been wearing the finger splint more   Pertinent History Recent surgery to Rt long finger 11/17/15, PMH: OA, TBI, back surgery for tumor removal    Limitations finger healed, no restrictions at this time (wears finger extension splint for pm)   Patient Stated Goals I want to get my finger straighter and less painful   Currently in Pain? Yes   Pain Score 6    Pain Location --  long finger   Pain Orientation Right   Pain Descriptors / Indicators Aching;Throbbing;Sore   Pain Type Chronic pain   Pain Onset More than a month ago   Pain Frequency Intermittent   Aggravating Factors  exercise    Pain Relieving Factors heat                      OT Treatments/Exercises (OP) - 03/23/16 0001      ADLs   ADL Comments Discussed d/c end of next week and reasons behind d/c secondary to no changes in PIP  motion. Pt only gives approx. 5*-10* with passive stretching from soft tissue, but the remainder of PIP extensor lag seems to be a bony contracture. Pt agrees and will continue to focus on soft tissue stretches and grip strength  (discussion while pt simultaneoulsy in fluidotherapy)     Hand Exercises   Other Hand Exercises Gripper set at level 3 to pick up blocks Rt hand for sustained grip strength. Pt with mod difficulty and increasing drops with fatigue.    Other Hand Exercises finger extension AA/ROM exercises with card activity     Ultrasound   Ultrasound Location volar palm/base of long finger   Ultrasound Parameters 3 Mhz, 0.8 wts/cm2, 20% x 8 minutes   Ultrasound Goals --  scar tissue      RUE Fluidotherapy   Number Minutes Fluidotherapy 12 Minutes   RUE Fluidotherapy Location Hand;Wrist   Comments at beginning of session to decr. stiffness/pain     Manual Therapy   Passive ROM passive stretching to PIP joint Rt long finger in extension                  OT Short Term Goals - 03/18/16 1401      OT SHORT TERM GOAL #1  Title Independent with HEP (Due 03/12/16)   Time 3   Period Weeks   Status Achieved     OT SHORT TERM GOAL #2   Title Pain less than or equal to 4/10 with daily activities   Time 3   Period Weeks   Status Not Met  5-8/10     OT SHORT TERM GOAL #3   Title Rt long finger extensor lag to improve by 10 degrees or >   Time 3   Period Weeks   Status Not Met  03/18/16: still -50*           OT Long Term Goals - 03/18/16 1402      OT LONG TERM GOAL #1   Title Independent with edema reduction techniques and splinting needs prn (due 04/02/16)   Time 6   Period Weeks   Status Achieved     OT LONG TERM GOAL #2   Title Grip strength Rt hand to be 65 lbs or greater to assist with opening jars/containers   Baseline 58 lbs   Time 6   Period Weeks   Status New     OT LONG TERM GOAL #3   Title Rt long finger extensor lag to improve by 20  degrees    Baseline -50   Time 6   Period Weeks   Status New               Plan - 03/23/16 1015    Clinical Impression Statement Pt tolerating sustained grip strength Rt hand.    Rehab Potential Fair   Clinical Impairments Affecting Rehab Potential time since original injury and time since surgery   OT Frequency 2x / week   OT Duration 6 weeks   OT Treatment/Interventions Self-care/ADL training;Moist Heat;Fluidtherapy;DME and/or AE instruction;Splinting;Patient/family education;Compression bandaging;Therapeutic exercises;Scar mobilization;Therapeutic activities;Ultrasound;Cryotherapy;Passive range of motion;Electrical Stimulation;Parrafin;Manual Therapy   Plan continue modalities, grip strength, PIP extension. D/c next week      Patient will benefit from skilled therapeutic intervention in order to improve the following deficits and impairments:  Decreased coordination, Decreased range of motion, Impaired flexibility, Increased edema, Impaired sensation, Decreased skin integrity, Impaired UE functional use, Pain, Decreased scar mobility, Decreased strength  Visit Diagnosis: Pain in joint of right hand  Stiffness of joint, hand, right  Muscle weakness (generalized)    Problem List Patient Active Problem List   Diagnosis Date Noted  . Osteoarthritis 02/19/2016  . TBI (traumatic brain injury) ([facility]) 02/19/2016    Carey Bullocks, OTR/L 03/23/2016, 10:17 AM  [facility] [address] [place], [place], [address] Phone: [phone]   Fax:  [phone]  Name: Jesse Galloway MRN: [id number] Date of Birth: 1960/12/19

## 2016-03-25 ENCOUNTER — Ambulatory Visit: Payer: Non-veteran care | Admitting: Occupational Therapy

## 2016-03-25 DIAGNOSIS — M25641 Stiffness of right hand, not elsewhere classified: Secondary | ICD-10-CM

## 2016-03-25 DIAGNOSIS — M25541 Pain in joints of right hand: Secondary | ICD-10-CM | POA: Diagnosis not present

## 2016-03-25 DIAGNOSIS — M6281 Muscle weakness (generalized): Secondary | ICD-10-CM

## 2016-03-25 NOTE — Therapy (Signed)
[facility] [address] [place], [place], [address] Phone: [phone]   Fax:  [phone]  Occupational Therapy Treatment  Patient Details  Name: Jesse Galloway MRN: [id number] Date of Birth: Sep 03, 1960 Referring Provider: Dr. Jorge Mandril  Encounter Date: 03/25/2016      OT End of Session - 03/25/16 1122    Visit Number 9   Number of Visits 13   Date for OT Re-Evaluation 04/02/16   Authorization Type VA   Authorization Time Period APPROVED 14 VISITS THRU 04/24/16   Authorization - Visit Number 8   Authorization - Number of Visits 13   OT Start Time 0930   OT Stop Time 1012   OT Time Calculation (min) 42 min   Equipment Utilized During Treatment fluido, ultrasound   Activity Tolerance Patient tolerated treatment well      Past Medical History:  Diagnosis Date  . Arthritis     Past Surgical History:  Procedure Laterality Date  . SPINE SURGERY      There were no vitals filed for this visit.      Subjective Assessment - 03/25/16 0950    Subjective  Pain was 8/10, but after meds and heat it's down to 5/10   Pertinent History Recent surgery to Rt long finger 11/17/15, PMH: OA, TBI, back surgery for tumor removal    Limitations finger healed, no restrictions at this time (wears finger extension splint for pm)   Patient Stated Goals I want to get my finger straighter and less painful   Currently in Pain? Yes   Pain Score 5    Pain Location --  long finger   Pain Orientation Right   Pain Descriptors / Indicators Aching;Sore   Pain Type Chronic pain   Pain Onset More than a month ago   Pain Frequency Intermittent   Aggravating Factors  exercise   Pain Relieving Factors heat, meds                      OT Treatments/Exercises (OP) - 03/25/16 0001      Hand Exercises   Other Hand Exercises Gripper set at level 3 to pick up blocks Rt hand for sustained grip strength. Pt with mod  difficulty and increasing drops with fatigue.      Ultrasound   Ultrasound Location volar palm/base of long finger   Ultrasound Parameters 3 Mhz, 20% pulsed, 0.8wts/cm2 x 8 minutes   Ultrasound Goals --  scar mngmt     RUE Fluidotherapy   Number Minutes Fluidotherapy 12 Minutes   RUE Fluidotherapy Location Hand;Wrist   Comments at beginning of session to decr pain/stiffness     Manual Therapy   Passive ROM passive stretching to PIP joint Rt long finger in extension                  OT Short Term Goals - 03/18/16 1401      OT SHORT TERM GOAL #1   Title Independent with HEP (Due 03/12/16)   Time 3   Period Weeks   Status Achieved     OT SHORT TERM GOAL #2   Title Pain less than or equal to 4/10 with daily activities   Time 3   Period Weeks   Status Not Met  5-8/10     OT SHORT TERM GOAL #3   Title Rt long finger extensor lag to improve by 10 degrees or >   Time 3   Period Weeks  Status Not Met  03/18/16: still -50*           OT Long Term Goals - 03/25/16 1123      OT LONG TERM GOAL #1   Title Independent with edema reduction techniques and splinting needs prn (due 04/02/16)   Time 6   Period Weeks   Status Achieved     OT LONG TERM GOAL #2   Title Grip strength Rt hand to be 65 lbs or greater to assist with opening jars/containers   Baseline 58 lbs   Time 6   Period Weeks   Status On-going     OT LONG TERM GOAL #3   Title Rt long finger extensor lag to improve by 20 degrees    Baseline -50   Time 6   Period Weeks   Status On-going               Plan - 03/25/16 1123    Clinical Impression Statement Pt progressing with sustained grip strength Rt hand. However, PIP motion remains limited.    Rehab Potential Fair   Clinical Impairments Affecting Rehab Potential time since original injury and time since surgery   OT Frequency 2x / week   OT Duration 6 weeks   OT Treatment/Interventions Self-care/ADL training;Moist Heat;Fluidtherapy;DME  and/or AE instruction;Splinting;Patient/family education;Compression bandaging;Therapeutic exercises;Scar mobilization;Therapeutic activities;Ultrasound;Cryotherapy;Passive range of motion;Electrical Stimulation;Parrafin;Manual Therapy   Plan continue modalities, grip strength, PIP extension as able. D/C next week   Consulted and Agree with Plan of Care Patient      Patient will benefit from skilled therapeutic intervention in order to improve the following deficits and impairments:  Decreased coordination, Decreased range of motion, Impaired flexibility, Increased edema, Impaired sensation, Decreased skin integrity, Impaired UE functional use, Pain, Decreased scar mobility, Decreased strength  Visit Diagnosis: Pain in joint of right hand  Stiffness of joint, hand, right  Muscle weakness (generalized)    Problem List Patient Active Problem List   Diagnosis Date Noted  . Osteoarthritis 02/19/2016  . TBI (traumatic brain injury) ([facility]) 02/19/2016    Carey Bullocks, OTR/L 03/25/2016, 11:25 AM  [facility] [address] [place], [place], [address] Phone: [phone]   Fax:  [phone]  Name: Ayvion Kavanagh MRN: [id number] Date of Birth: 02-20-1961

## 2016-03-30 ENCOUNTER — Ambulatory Visit: Payer: Non-veteran care | Attending: Internal Medicine | Admitting: Occupational Therapy

## 2016-03-30 DIAGNOSIS — M25541 Pain in joints of right hand: Secondary | ICD-10-CM | POA: Insufficient documentation

## 2016-03-30 DIAGNOSIS — M25641 Stiffness of right hand, not elsewhere classified: Secondary | ICD-10-CM | POA: Insufficient documentation

## 2016-03-30 DIAGNOSIS — M6281 Muscle weakness (generalized): Secondary | ICD-10-CM | POA: Insufficient documentation

## 2016-03-30 NOTE — Therapy (Signed)
[facility] [address] [place], [place], [address] Phone: [phone]   Fax:  [phone]  Occupational Therapy Treatment  Patient Details  Name: Jesse Galloway MRN: [id number] Date of Birth: 1961-03-07 Referring Provider: Dr. Jorge Mandril  Encounter Date: 03/30/2016      OT End of Session - 03/30/16 1007    Visit Number 10   Number of Visits 13   Date for OT Re-Evaluation 04/02/16   Authorization Type VA   Authorization Time Period APPROVED 14 VISITS THRU 04/24/16   Authorization - Visit Number 9   Authorization - Number of Visits 13   OT Start Time 0932   OT Stop Time 1015   OT Time Calculation (min) 43 min   Equipment Utilized During Treatment fluido, ultrasound   Activity Tolerance Patient tolerated treatment well      Past Medical History:  Diagnosis Date  . Arthritis     Past Surgical History:  Procedure Laterality Date  . SPINE SURGERY      There were no vitals filed for this visit.      Subjective Assessment - 03/30/16 0949    Pertinent History Recent surgery to Rt long finger 11/17/15, PMH: OA, TBI, back surgery for tumor removal    Limitations finger healed, no restrictions at this time (wears finger extension splint for pm)   Patient Stated Goals I want to get my finger straighter and less painful   Currently in Pain? Yes   Pain Score 5    Pain Location --  long finger   Pain Orientation Right   Pain Descriptors / Indicators Aching;Sore   Pain Type Chronic pain   Pain Onset More than a month ago   Pain Frequency Intermittent   Aggravating Factors  exercise   Pain Relieving Factors heat, meds                      OT Treatments/Exercises (OP) - 03/30/16 0001      Hand Exercises   Other Hand Exercises Gripper set at level 3 to pick up blocks Rt hand for sustained grip strength. Pt with mod difficulty and increasing drops with fatigue.      Ultrasound   Ultrasound  Location volar palm/base of long finger   Ultrasound Parameters 3 Mhz, 0.8 wts/cm2, 50% pulsed x 8 minutes   Ultrasound Goals --  scar tissue mngmt     RUE Fluidotherapy   Number Minutes Fluidotherapy 12 Minutes   RUE Fluidotherapy Location Hand;Wrist   Comments at beginning of session to decr. stiffness/pain     Manual Therapy   Passive ROM passive stretching to PIP joint Rt long finger in extension                  OT Short Term Goals - 03/18/16 1401      OT SHORT TERM GOAL #1   Title Independent with HEP (Due 03/12/16)   Time 3   Period Weeks   Status Achieved     OT SHORT TERM GOAL #2   Title Pain less than or equal to 4/10 with daily activities   Time 3   Period Weeks   Status Not Met  5-8/10     OT SHORT TERM GOAL #3   Title Rt long finger extensor lag to improve by 10 degrees or >   Time 3   Period Weeks   Status Not Met  03/18/16: still -50*  OT Long Term Goals - 03/25/16 1123      OT LONG TERM GOAL #1   Title Independent with edema reduction techniques and splinting needs prn (due 04/02/16)   Time 6   Period Weeks   Status Achieved     OT LONG TERM GOAL #2   Title Grip strength Rt hand to be 65 lbs or greater to assist with opening jars/containers   Baseline 58 lbs   Time 6   Period Weeks   Status On-going     OT LONG TERM GOAL #3   Title Rt long finger extensor lag to improve by 20 degrees    Baseline -50   Time 6   Period Weeks   Status On-going               Plan - 03/30/16 1008    Clinical Impression Statement Pt progressing with sustained grip strength Rt hand, but PIP motion remains limited   Rehab Potential Fair   Clinical Impairments Affecting Rehab Potential time since original injury and time since surgery   OT Frequency 2x / week   OT Duration 6 weeks   OT Treatment/Interventions Self-care/ADL training;Moist Heat;Fluidtherapy;DME and/or AE instruction;Splinting;Patient/family education;Compression  bandaging;Therapeutic exercises;Scar mobilization;Therapeutic activities;Ultrasound;Cryotherapy;Passive range of motion;Electrical Stimulation;Parrafin;Manual Therapy   Plan check remaining goals, G-code,  and d/c next session   Consulted and Agree with Plan of Care Patient      Patient will benefit from skilled therapeutic intervention in order to improve the following deficits and impairments:  Decreased coordination, Decreased range of motion, Impaired flexibility, Increased edema, Impaired sensation, Decreased skin integrity, Impaired UE functional use, Pain, Decreased scar mobility, Decreased strength  Visit Diagnosis: Pain in joint of right hand  Stiffness of joint, hand, right  Muscle weakness (generalized)    Problem List Patient Active Problem List   Diagnosis Date Noted  . Osteoarthritis 02/19/2016  . TBI (traumatic brain injury) ([facility]) 02/19/2016    Carey Bullocks, OTR/L 03/30/2016, 10:11 AM  [facility] [address] [place], [place], [address] Phone: [phone]   Fax:  [phone]  Name: Jesse Galloway MRN: [id number] Date of Birth: 07/23/1961

## 2016-04-01 ENCOUNTER — Ambulatory Visit: Payer: Non-veteran care | Admitting: Occupational Therapy

## 2016-04-01 DIAGNOSIS — M25541 Pain in joints of right hand: Secondary | ICD-10-CM

## 2016-04-01 DIAGNOSIS — M6281 Muscle weakness (generalized): Secondary | ICD-10-CM

## 2016-04-01 DIAGNOSIS — M25641 Stiffness of right hand, not elsewhere classified: Secondary | ICD-10-CM

## 2016-04-01 NOTE — Therapy (Signed)
[facility] [address] [place], [place], [address] Phone: [phone]   Fax:  [phone]  Occupational Therapy Treatment  Patient Details  Name: Jesse Galloway MRN: [id number] Date of Birth: 1961/03/16 Referring Provider: Dr. Jorge Mandril  Encounter Date: 04/01/2016      OT End of Session - 04/01/16 1351    Visit Number 11   Number of Visits 13   Date for OT Re-Evaluation 04/02/16   Authorization Type VA   Authorization Time Period APPROVED 14 VISITS THRU 04/24/16   Authorization - Visit Number 10   Authorization - Number of Visits 13   OT Start Time 2947   OT Stop Time 1400   OT Time Calculation (min) 45 min   Equipment Utilized During Treatment fluido   Activity Tolerance Patient tolerated treatment well      Past Medical History:  Diagnosis Date  . Arthritis     Past Surgical History:  Procedure Laterality Date  . SPINE SURGERY      There were no vitals filed for this visit.      Subjective Assessment - 04/01/16 1316    Pertinent History Recent surgery to Rt long finger 11/17/15, PMH: OA, TBI, back surgery for tumor removal    Limitations finger healed, no restrictions at this time (wears finger extension splint for pm)   Patient Stated Goals I want to get my finger straighter and less painful   Currently in Pain? Yes   Pain Score 7    Pain Location --  long finger   Pain Orientation Right   Pain Descriptors / Indicators Aching;Sore   Pain Type Chronic pain   Pain Onset More than a month ago   Pain Frequency Intermittent   Aggravating Factors  exercise   Pain Relieving Factors heat, meds            [facility] OT Assessment - 04/01/16 0001      Right Hand AROM   R Long PIP 0-100 95 Degrees  extensor lag -48 to - 50*     Hand Function   Right Hand Grip (lbs) 56 lbs                  OT Treatments/Exercises (OP) - 04/01/16 0001      ADLs   ADL Comments Assessed  remaining goals and reviewed with patient d/c due to lack of progress. Scar massage over new incision with noted flexor tendon shortening     Hand Exercises   Other Hand Exercises Gripper set at level 3 to pick up blocks Rt hand for sustained grip strength. Pt with min difficulty and increasing drops near end of task     RUE Fluidotherapy   Number Minutes Fluidotherapy 12 Minutes   RUE Fluidotherapy Location Hand;Wrist   Comments to decr. stiffness/pain     Manual Therapy   Passive ROM passive stretching to PIP joint Rt long finger in extension                  OT Short Term Goals - 03/18/16 1401      OT SHORT TERM GOAL #1   Title Independent with HEP (Due 03/12/16)   Time 3   Period Weeks   Status Achieved     OT SHORT TERM GOAL #2   Title Pain less than or equal to 4/10 with daily activities   Time 3   Period Weeks   Status Not Met  5-8/10  OT SHORT TERM GOAL #3   Title Rt long finger extensor lag to improve by 10 degrees or >   Time 3   Period Weeks   Status Not Met  03/18/16: still -50*           OT Long Term Goals - Apr 30, 2016 1331      OT LONG TERM GOAL #1   Title Independent with edema reduction techniques and splinting needs prn (due 04/02/16)   Time 6   Period Weeks   Status Achieved     OT LONG TERM GOAL #2   Title Grip strength Rt hand to be 65 lbs or greater to assist with opening jars/containers   Baseline 58 lbs   Time 6   Period Weeks   Status Not Met  56 lbs at d/c     OT LONG TERM GOAL #3   Title Rt long finger extensor lag to improve by 20 degrees    Baseline -50   Time 6   Period Weeks   Status Not Met  -48 to -50 degrees lag               Plan - Apr 30, 2016 1332    Clinical Impression Statement Pt has not made progress in pain level, grip strength, or PIP motion since evaluation. Will d/c due to lack of progress   Plan D/C O.T. due to lack of progress   Consulted and Agree with Plan of Care Patient      Patient  will benefit from skilled therapeutic intervention in order to improve the following deficits and impairments:     Visit Diagnosis: Pain in joint of right hand  Stiffness of joint, hand, right  Muscle weakness (generalized)      G-Codes - 2016-04-30 1324    Functional Assessment Tool Used Rt long finger mod edema, pain 5-8/10, extensor lag PIP joint -50*   Functional Limitation Changing and maintaining body position   Changing and Maintaining Body Position Goal Status (O9735) At least 20 percent but less than 40 percent impaired, limited or restricted   Changing and Maintaining Body Position Discharge Status (H2992) At least 40 percent but less than 60 percent impaired, limited or restricted      Problem List Patient Active Problem List   Diagnosis Date Noted  . Osteoarthritis 02/19/2016  . TBI (traumatic brain injury) ([facility]) 02/19/2016    OCCUPATIONAL THERAPY DISCHARGE SUMMARY  Visits from Start of Care: 10   Current functional level related to goals / functional outcomes: SEE ABOVE   Remaining deficits: Pain Rt long finger remains b/t 5-8/10 Significant PIP extensor lag Rt long finger (-50*) Decreased strength Rt hand   Education / Equipment: HEP's, pain management, splint wear and care reviewed  Plan: Patient agrees to discharge.  Patient goals were not met. Patient is being discharged due to lack of progress.  ?????       Carey Bullocks, OTR/L 2016-04-30, 1:52 PM  [facility] [address] [place], [place], [address] Phone: [phone]   Fax:  [phone]  Name: Jesse Galloway MRN: [id number] Date of Birth: 1960/10/22

## 2016-04-06 ENCOUNTER — Encounter: Payer: No Typology Code available for payment source | Admitting: Occupational Therapy

## 2016-04-08 ENCOUNTER — Encounter: Payer: No Typology Code available for payment source | Admitting: Occupational Therapy

## 2016-04-13 ENCOUNTER — Encounter: Payer: No Typology Code available for payment source | Admitting: Occupational Therapy

## 2016-04-15 ENCOUNTER — Encounter: Payer: No Typology Code available for payment source | Admitting: Occupational Therapy

## 2016-04-20 ENCOUNTER — Encounter: Payer: No Typology Code available for payment source | Admitting: Occupational Therapy

## 2018-05-09 DIAGNOSIS — F4322 Adjustment disorder with anxiety: Secondary | ICD-10-CM | POA: Diagnosis not present

## 2018-05-16 DIAGNOSIS — F4322 Adjustment disorder with anxiety: Secondary | ICD-10-CM | POA: Diagnosis not present

## 2018-06-09 DIAGNOSIS — F4322 Adjustment disorder with anxiety: Secondary | ICD-10-CM | POA: Diagnosis not present

## 2018-06-14 DIAGNOSIS — F4312 Post-traumatic stress disorder, chronic: Secondary | ICD-10-CM | POA: Diagnosis not present

## 2018-06-16 DIAGNOSIS — F4322 Adjustment disorder with anxiety: Secondary | ICD-10-CM | POA: Diagnosis not present

## 2018-06-20 DIAGNOSIS — F4312 Post-traumatic stress disorder, chronic: Secondary | ICD-10-CM | POA: Diagnosis not present

## 2018-09-27 DIAGNOSIS — F4310 Post-traumatic stress disorder, unspecified: Secondary | ICD-10-CM | POA: Diagnosis not present

## 2018-10-04 DIAGNOSIS — F4310 Post-traumatic stress disorder, unspecified: Secondary | ICD-10-CM | POA: Diagnosis not present

## 2018-10-19 DIAGNOSIS — F4310 Post-traumatic stress disorder, unspecified: Secondary | ICD-10-CM | POA: Diagnosis not present

## 2018-10-25 DIAGNOSIS — F4310 Post-traumatic stress disorder, unspecified: Secondary | ICD-10-CM | POA: Diagnosis not present

## 2018-11-01 DIAGNOSIS — F4310 Post-traumatic stress disorder, unspecified: Secondary | ICD-10-CM | POA: Diagnosis not present

## 2018-11-08 DIAGNOSIS — F4310 Post-traumatic stress disorder, unspecified: Secondary | ICD-10-CM | POA: Diagnosis not present

## 2018-11-15 DIAGNOSIS — F4310 Post-traumatic stress disorder, unspecified: Secondary | ICD-10-CM | POA: Diagnosis not present

## 2019-01-30 ENCOUNTER — Other Ambulatory Visit: Payer: Self-pay

## 2019-01-30 ENCOUNTER — Emergency Department (HOSPITAL_COMMUNITY)
Admission: EM | Admit: 2019-01-30 | Discharge: 2019-01-30 | Disposition: A | Discharge status: Home / Self Care | Payer: No Typology Code available for payment source | Attending: Emergency Medicine | Admitting: Emergency Medicine

## 2019-01-30 ENCOUNTER — Encounter (HOSPITAL_COMMUNITY): Payer: Self-pay | Admitting: Emergency Medicine

## 2019-01-30 ENCOUNTER — Emergency Department (HOSPITAL_COMMUNITY): Payer: No Typology Code available for payment source

## 2019-01-30 DIAGNOSIS — F17200 Nicotine dependence, unspecified, uncomplicated: Secondary | ICD-10-CM | POA: Insufficient documentation

## 2019-01-30 DIAGNOSIS — R4020 Unspecified coma: Secondary | ICD-10-CM | POA: Diagnosis not present

## 2019-01-30 DIAGNOSIS — R0902 Hypoxemia: Secondary | ICD-10-CM | POA: Diagnosis not present

## 2019-01-30 DIAGNOSIS — Z79899 Other long term (current) drug therapy: Secondary | ICD-10-CM | POA: Diagnosis not present

## 2019-01-30 DIAGNOSIS — T40601A Poisoning by unspecified narcotics, accidental (unintentional), initial encounter: Secondary | ICD-10-CM | POA: Diagnosis not present

## 2019-01-30 DIAGNOSIS — R404 Transient alteration of awareness: Secondary | ICD-10-CM | POA: Diagnosis not present

## 2019-01-30 DIAGNOSIS — R52 Pain, unspecified: Secondary | ICD-10-CM | POA: Diagnosis not present

## 2019-01-30 DIAGNOSIS — R4182 Altered mental status, unspecified: Secondary | ICD-10-CM | POA: Diagnosis not present

## 2019-01-30 LAB — COMPREHENSIVE METABOLIC PANEL
ALT: 13 U/L (ref 0–44)
AST: 17 U/L (ref 15–41)
Albumin: 3.9 g/dL (ref 3.5–5.0)
Alkaline Phosphatase: 62 U/L (ref 38–126)
Anion gap: 9 (ref 5–15)
BUN: 15 mg/dL (ref 6–20)
CO2: 21 mmol/L — ABNORMAL LOW (ref 22–32)
Calcium: 9 mg/dL (ref 8.9–10.3)
Chloride: 108 mmol/L (ref 98–111)
Creatinine, Ser: 1.5 mg/dL — ABNORMAL HIGH (ref 0.61–1.24)
GFR calc Af Amer: 58 mL/min — ABNORMAL LOW (ref >60)
GFR calc non Af Amer: 50 mL/min — ABNORMAL LOW (ref >60)
Glucose, Bld: 113 mg/dL — ABNORMAL HIGH (ref 70–99)
Potassium: 4.5 mmol/L (ref 3.5–5.1)
Sodium: 138 mmol/L (ref 135–145)
Total Bilirubin: 0.9 mg/dL (ref 0.3–1.2)
Total Protein: 7.3 g/dL (ref 6.5–8.1)

## 2019-01-30 LAB — DIFFERENTIAL
Abs Immature Granulocytes: 0.04 10*3/uL (ref 0.00–0.07)
Basophils Absolute: 0 10*3/uL (ref 0.0–0.1)
Basophils Relative: 0 %
Eosinophils Absolute: 0.1 10*3/uL (ref 0.0–0.5)
Eosinophils Relative: 1 %
Immature Granulocytes: 0 %
Lymphocytes Relative: 24 %
Lymphs Abs: 2.4 10*3/uL (ref 0.7–4.0)
Monocytes Absolute: 0.9 10*3/uL (ref 0.1–1.0)
Monocytes Relative: 9 %
Neutro Abs: 6.7 10*3/uL (ref 1.7–7.7)
Neutrophils Relative %: 66 %

## 2019-01-30 LAB — CBC
HCT: 43.9 % (ref 39.0–52.0)
Hemoglobin: 13.9 g/dL (ref 13.0–17.0)
MCH: 30.5 pg (ref 26.0–34.0)
MCHC: 31.7 g/dL (ref 30.0–36.0)
MCV: 96.3 fL (ref 80.0–100.0)
Platelets: 270 10*3/uL (ref 150–400)
RBC: 4.56 MIL/uL (ref 4.22–5.81)
RDW: 12.9 % (ref 11.5–15.5)
WBC: 10 10*3/uL (ref 4.0–10.5)
nRBC: 0 % (ref 0.0–0.2)

## 2019-01-30 LAB — I-STAT CHEM 8, ED
BUN: 18 mg/dL (ref 6–20)
Calcium, Ion: 1.14 mmol/L — ABNORMAL LOW (ref 1.15–1.40)
Chloride: 107 mmol/L (ref 98–111)
Creatinine, Ser: 1.3 mg/dL — ABNORMAL HIGH (ref 0.61–1.24)
Glucose, Bld: 115 mg/dL — ABNORMAL HIGH (ref 70–99)
HCT: 44 % (ref 39.0–52.0)
Hemoglobin: 15 g/dL (ref 13.0–17.0)
Potassium: 4.6 mmol/L (ref 3.5–5.1)
Sodium: 138 mmol/L (ref 135–145)
TCO2: 25 mmol/L (ref 22–32)

## 2019-01-30 LAB — CBG MONITORING, ED: Glucose-Capillary: 118 mg/dL — ABNORMAL HIGH (ref 70–99)

## 2019-01-30 LAB — PROTIME-INR
INR: 1.1 (ref 0.8–1.2)
Prothrombin Time: 14.2 seconds (ref 11.4–15.2)

## 2019-01-30 LAB — APTT: aPTT: 28 seconds (ref 24–36)

## 2019-01-30 MED ORDER — SODIUM CHLORIDE 0.9% FLUSH
3.0000 mL | Freq: Once | INTRAVENOUS | Status: AC
  Administered 2019-01-30: 3 mL via INTRAVENOUS

## 2019-01-30 MED ORDER — NALOXONE HCL 2 MG/2ML IJ SOSY
1.0000 mg | PREFILLED_SYRINGE | Freq: Once | INTRAMUSCULAR | Status: AC
  Administered 2019-01-30: 1 mg via INTRAVENOUS

## 2019-01-30 NOTE — ED Provider Notes (Signed)
[facility]   CSN: [id number] Arrival date & time: 01/30/19  1314    History   Chief Complaint Chief Complaint  Patient presents with  . Altered Mental Status    HPI Jesse Galloway is a 58 y.o. male.     HPI EMS was called for a male with history of traumatic brain injury but normal functionality falling out of his chair and becoming poorly responsive with decreased GCS.  Fire arrived first and patient was apparently hypoxic and placed on a nonrebreather mask.  Upon EMS arrival they did check saturation by removing mask and patient immediately started desaturating into the 80s.  They maintain nonrebreather mask.  During transport patient was essentially obtunded and they did also note a little bit of posturing but no active tonic-clonic seizure motion.  Blood pressures were mid range hypertensive.  IV started and patient transported.  On arrival, patient was obtunded with slow deep respirations.  Was given Narcan which resolved all mental status changes.  Now history is obtainable per patient.  He does report he takes pain medications chronically.  At this time he cannot remember the name.  He reports he had taken his evening dose.  At this time he is not endorsing having taking extra doses or intentionally overdosing.  He denies he is having pain.  He denies headache.  He denies chest pain.  He reports he feels a little lightheaded.  Denies focal weakness numbness or tingling of extremities.  He reports he is been well up until this time.  He is got a negative review of systems.  Patient reports he does drink alcohol occasionally but not on a regular basis and denies ever having issues of withdrawal. Past Medical History:  Diagnosis Date  . Arthritis     Patient Active Problem List   Diagnosis Date Noted  . Osteoarthritis 02/19/2016  . TBI (traumatic brain injury) (HCC) 02/19/2016    Past Surgical History:  Procedure  Laterality Date  . SPINE SURGERY          Home Medications    Prior to Admission medications   Medication Sig Start Date End Date Taking? Authorizing Provider  ARIPiprazole (ABILIFY) 10 MG tablet Take 5 mg by mouth at bedtime.   Yes [provider]  finasteride (PROSCAR) 5 MG tablet Take 5 mg by mouth daily.   Yes [provider]  meloxicam (MOBIC) 15 MG tablet Take 15 mg by mouth daily.   Yes [provider]  nortriptyline (PAMELOR) 25 MG capsule Take 50 mg by mouth at bedtime.   Yes [provider]  oxybutynin (DITROPAN-XL) 10 MG 24 hr tablet Take 10 mg by mouth every evening.   Yes [provider]  polyvinyl alcohol (LIQUIFILM TEARS) 1.4 % ophthalmic solution Place 1 drop into both eyes as needed for dry eyes.   Yes [provider]  propranolol (INDERAL) 40 MG tablet Take 40 mg by mouth 2 (two) times daily.   Yes [provider]  sertraline (ZOLOFT) 100 MG tablet Take 100 mg by mouth daily.   Yes [provider]  sildenafil (VIAGRA) 100 MG tablet Take 100 mg by mouth daily as needed for erectile dysfunction.   Yes [provider]  simvastatin (ZOCOR) 40 MG tablet Take 20 mg by mouth at bedtime.   Yes [provider]  tamsulosin (FLOMAX) 0.4 MG CAPS capsule Take 0.4 mg by mouth 2 (two) times a day.   Yes [provider]    Family History No family history on file.  Social History Social History   Tobacco Use  . Smoking status: Current Every Day Smoker  Substance Use Topics  . Alcohol use: Yes  . Drug use: Never     Allergies   Patient has no known allergies.   Review of Systems Review of Systems 10 Systems reviewed and are negative for acute change except as noted in the HPI.   Physical Exam Updated Vital Signs BP (!) 145/95   Pulse 81   Temp 98.5 F (36.9 C) (Oral)   Resp 11   Ht 5' 9.5" (1.765 m)   Wt 95 kg   SpO2 100%   BMI 30.49 kg/m   Physical Exam  Constitutional:      Comments: On arrival, patient is obtunded with very slow deep respirations.  He appears to just have slight upper extremity flexor tone holding his arms ever so slightly in flexion in the wrists bent downward.  He is not having active seizure movements.  HENT:     Head: Normocephalic and atraumatic.     Mouth/Throat:     Mouth: Mucous membranes are moist.     Pharynx: Oropharynx is clear.  Eyes:     Comments: Initial pupils are 1 to 2 mm and symmetric.  Eye motions are slightly roving.  He does have corneal response.  Neck:     Musculoskeletal: Neck supple.  Cardiovascular:     Rate and Rhythm: Normal rate and regular rhythm.  Pulmonary:     Comments: Initial respiratory effort is for very slow respiratory rate with intermittent deep breaths. Abdominal:     General: There is no distension.     Palpations: Abdomen is soft.     Tenderness: There is no abdominal tenderness. There is no guarding.  Musculoskeletal:        General: No swelling or tenderness.     Right lower leg: No edema.     Left lower leg: No edema.     Comments: No appearance of any extremity injuries or deformities.  No peripheral edema.  Condition of extremities is generally good and with good muscle development.  Skin:    General: Skin is warm and dry.  Neurological:     Comments: Initial neurologic exam is for obtundation with corneal response and slow deep respirations and slight tonic flexion of the upper extremities.  After administration of Narcan, his mental status immediately became clear.  He began speaking in a normal voice and answering questions without hesitation.  Correctly identifies his age his address and medical history.  After that point neurologic exam is normal with normal symmetric motor strength upper and lower extremities.  Patient is coordinated in his actions and able to use a urinal independently.  Psychiatric:        Mood and Affect: Mood normal.      ED Treatments /  Results  Labs (all labs ordered are listed, but only abnormal results are displayed) Labs Reviewed  COMPREHENSIVE METABOLIC PANEL - Abnormal; Notable for the following components:      Result Value   CO2 21 (*)    Glucose, Bld 113 (*)    Creatinine, Ser 1.50 (*)    GFR calc non Af Amer 50 (*)    GFR calc Af Amer 58 (*)    All other components within normal limits  I-STAT CHEM 8, ED - Abnormal; Notable for the following components:   Creatinine, Ser 1.30 (*)  Glucose, Bld 115 (*)    Calcium, Ion 1.14 (*)    All other components within normal limits  CBG MONITORING, ED - Abnormal; Notable for the following components:   Glucose-Capillary 118 (*)    All other components within normal limits  PROTIME-INR  APTT  CBC  DIFFERENTIAL    EKG EKG Interpretation  Date/Time:  Tuesday January 30 2019 13:16:30 EDT Ventricular Rate:  110 PR Interval:    QRS Duration: 103 QT Interval:  339 QTC Calculation: 459 R Axis:   -29 Text Interpretation:  Sinus tachycardia Borderline left axis deviation Borderline T abnormalities, lateral leads no STEMI, T waves less elevated and peaked compared to old study Confirmed by Arby BarrettePfeiffer, Loel Betancur [phone]([id number]) on 01/30/2019 2:34:21 PM   Radiology Ct Head Wo Contrast  Result Date: 01/30/2019 CLINICAL DATA:  Fall from chair with unresponsiveness EXAM: CT HEAD WITHOUT CONTRAST TECHNIQUE: Contiguous axial images were obtained from the base of the skull through the vertex without intravenous contrast. COMPARISON:  None. FINDINGS: Brain: No evidence of acute infarction, hemorrhage, hydrocephalus, extra-axial collection or mass lesion/mass effect. Mild atrophic changes are noted. Vascular: No hyperdense vessel or unexpected calcification. Skull: Normal. Negative for fracture or focal lesion. Sinuses/Orbits: No acute finding. Other: None. IMPRESSION: Mild atrophic changes are noted. No acute intracranial abnormality is seen. Electronically Signed   By: Alcide CleverMark  Lukens M.D.   On:  01/30/2019 15:29    Procedures Procedures (including critical care time) CRITICAL CARE Performed by: Arby BarretteMarcy Kyrese Gartman   Total critical care time: 30 minutes  Critical care time was exclusive of separately billable procedures and treating other patients.  Critical care was necessary to treat or prevent imminent or life-threatening deterioration.  Critical care was time spent personally by me on the following activities: development of treatment plan with patient and/or surrogate as well as nursing, discussions with consultants, evaluation of patient's response to treatment, examination of patient, obtaining history from patient or surrogate, ordering and performing treatments and interventions, ordering and review of laboratory studies, ordering and review of radiographic studies, pulse oximetry and re-evaluation of patient's condition. Medications Ordered in ED Medications  naloxone (NARCAN) injection 1 mg (1 mg Intravenous Given 01/30/19 1315)  sodium chloride flush (NS) 0.9 % injection 3 mL (3 mLs Intravenous Given 01/30/19 1604)     Initial Impression / Assessment and Plan / ED Course  I have reviewed the triage vital signs and the nursing notes.  Pertinent labs & imaging results that were available during my care of the patient were reviewed by me and considered in my medical decision making (see chart for details).  Clinical Course as of Jan 29 1814  Tue Jan 30, 2019  1539 Recheck: Patient is doing very well.  He is alert and stable in appearance.  No cognitive delays.  He is still not sure how he got overdosed on medications.  He reports he usually takes his pain medication once a day and does not think that he doubled up but that seems like the most likely explanation.  Denies any intentional self-harm and denies other drugs of abuse.   [MP]    Clinical Course User Index [MP] Arby BarrettePfeiffer, Madesyn Ast, MD      First report was for a patient who had head injury from falling out of a chair  and obtunded mental status.  After evaluation, it is clear patient had narcotic overdose as there was complete resolution of obtundation with Narcan.  However with report of fall from chair and possible head  injury will proceed with CT scan.  Patient has history of TBI but is very highly functional at baseline.  He will require ongoing observation as at this time it is unclear what narcotic and how long-acting it is of the patient took will try to get clarification on this.  This time, patient has stable vital signs and stable mental status with normal respirations after treatment with Narcan.  Patient has been present for almost 5 hours with no rebound of narcotic somnolence.  Mental status has remained completely normal.  At this time stable for discharge.  She will go home with his brother with whom he lives.  Final Clinical Impressions(s) / ED Diagnoses   Final diagnoses:  Narcotic overdose, accidental or unintentional, initial encounter [facility](HCC)    ED Discharge Orders    None       Arby BarrettePfeiffer, Tannisha Kennington, MD 01/30/19 1815

## 2019-01-30 NOTE — ED Triage Notes (Signed)
Arrived via EMS patient unresponsive fell out of chair witnessed by family. Upon arrival EMS reported patient on a NRB by fire department en route took off NRB pulse oximetry decreased into the  80's placed back on NRB 15L increased to 98%. Upon arrival patient unresponsive breathing 6 time per minute. Doctor at bedside side ordered Narcan 1mg  IV patient became alert answering and following commands appropriate.

## 2019-01-30 NOTE — ED Notes (Addendum)
Brother: Gaspar Bidding [phone]

## 2019-01-30 NOTE — Discharge Instructions (Addendum)
1.  You accidentally overdosed on your pain medications.  Immediately awaken after being given Narcan which reverses narcotic pain medications. 2.  Do not take any more of your pain medications until you have reviewed all of them with your doctor who is prescribing.  Risk overdose. 3.  You must to stay with a responsible adult for the next 24 hours and follow-up with your doctor before resuming your pain medications.

## 2019-01-30 NOTE — ED Notes (Signed)
Pt passed the stroke swallow screen with no difficulty.

## 2019-01-30 NOTE — ED Notes (Signed)
Patient states takes pain medication chronic bilateral hip pain. Last took medication at 1030 unsure of the name.

## 2019-01-30 NOTE — ED Notes (Signed)
Aaron Edelman (brother) [phone] came to lobby for update.  [facility] RN, will have pt call him. Levada Schilling same.

## 2019-08-11 IMAGING — CT CT HEAD WITHOUT CONTRAST
3 of 4 series · 16 of 47 positions shown, 19 images · non-contrast
Comparison: None.

CLINICAL DATA: Fall from chair with unresponsiveness

EXAM:
CT HEAD WITHOUT CONTRAST
TECHNIQUE: Contiguous axial images were obtained from the base of the skull
through the vertex without intravenous contrast.

[Series 3: head 2.0 h70h · axial · 0.45mm/px · z∈[-107,+43]mm · 10 of 89 slices shown, 13 images]
[im 9/89  brain]
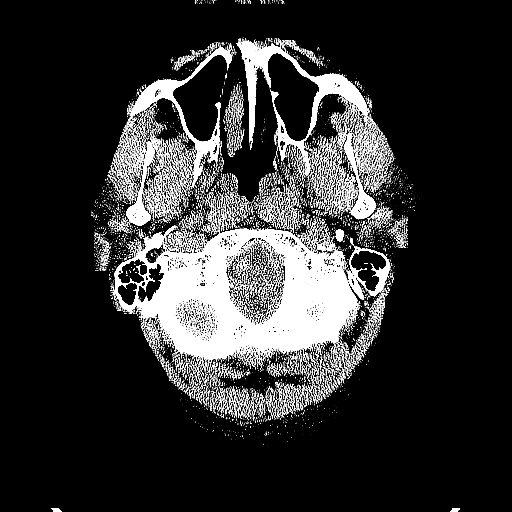
[im 9/89  bone]
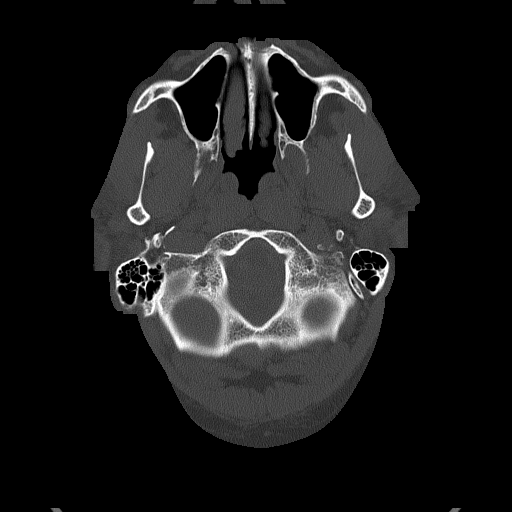
[im 17/89  brain]
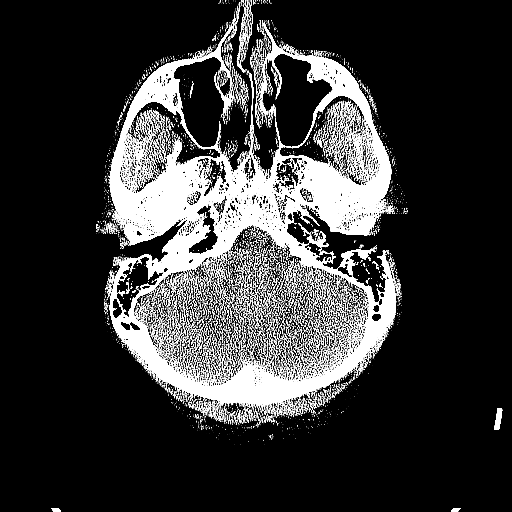
[im 26/89  brain]
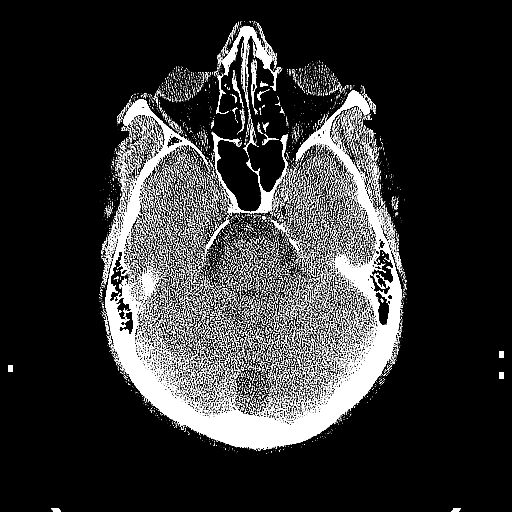
[im 34/89  brain]
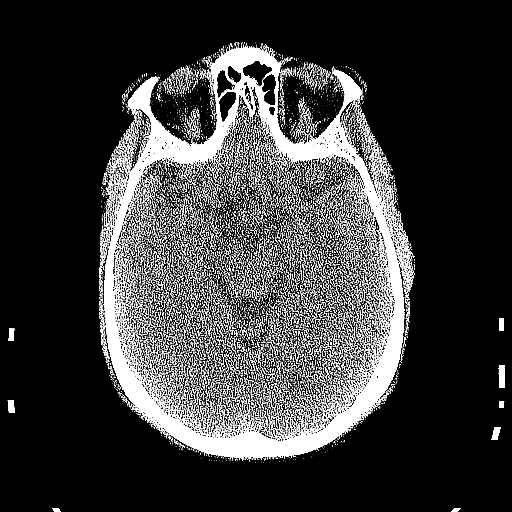
[im 42/89  brain]
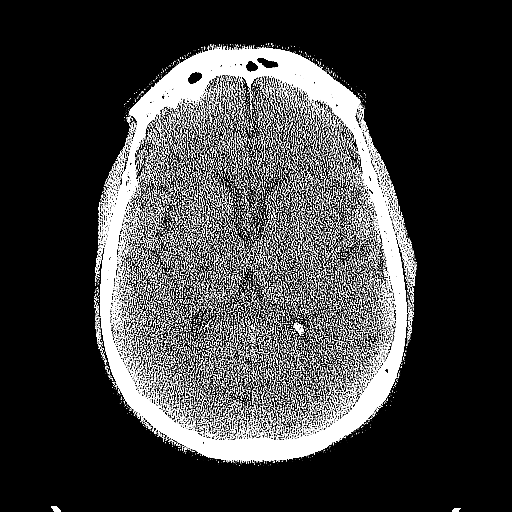
[im 42/89  bone]
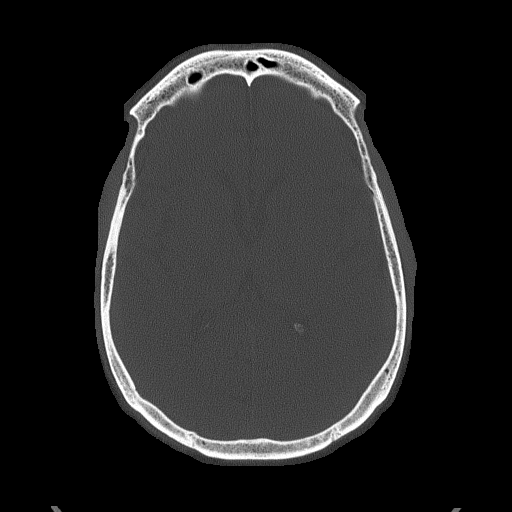
[im 51/89  brain]
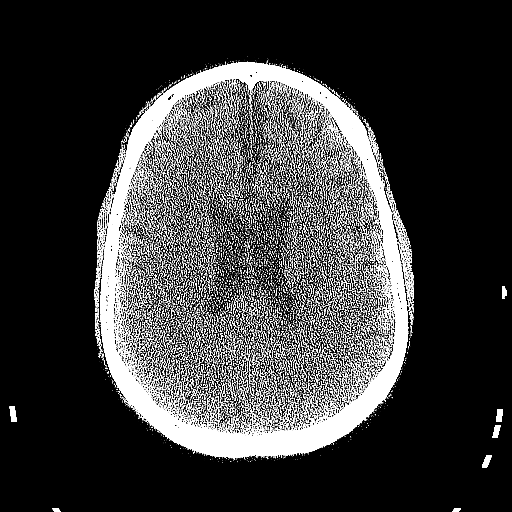
[im 59/89  brain]
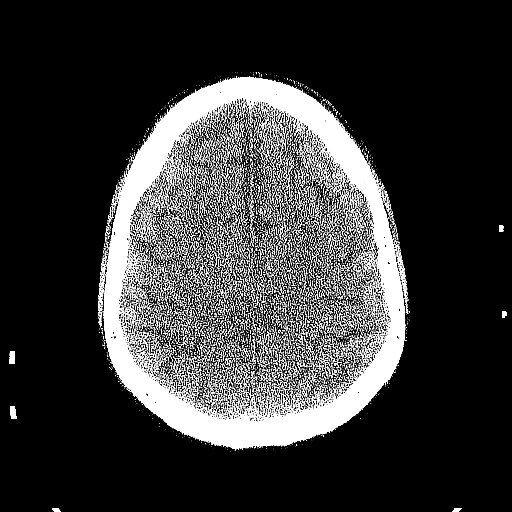
[im 68/89  brain]
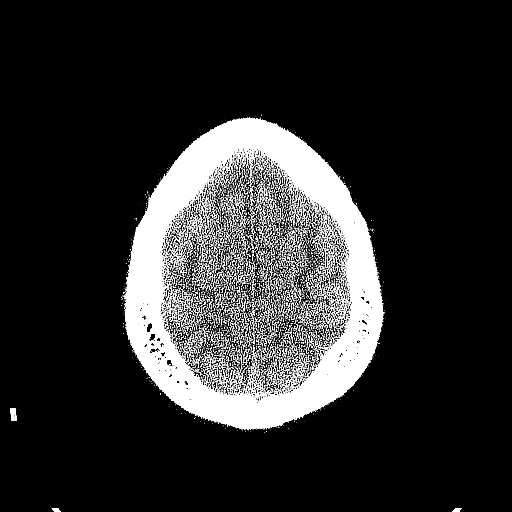
[im 76/89  brain]
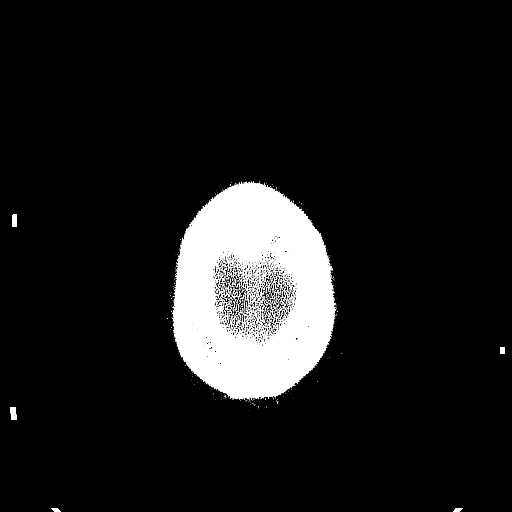
[im 76/89  bone]
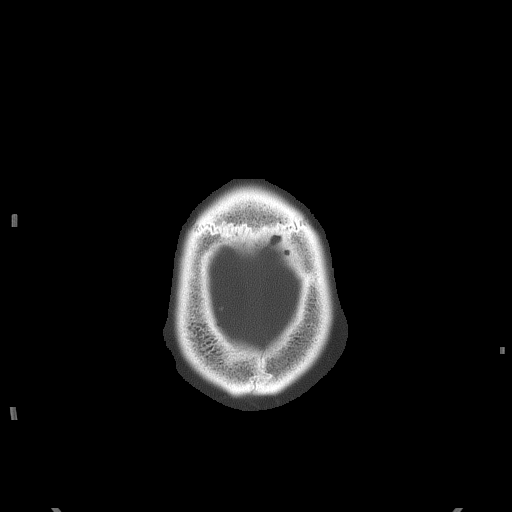
[im 84/89  brain]
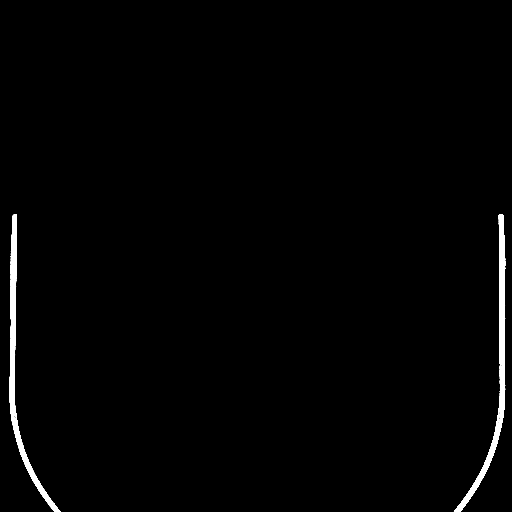

[Series 4: head 3.0 mpr cor · coronal · 0.35mm/px · 3 of 75 slices shown]
[im 25/75  brain]
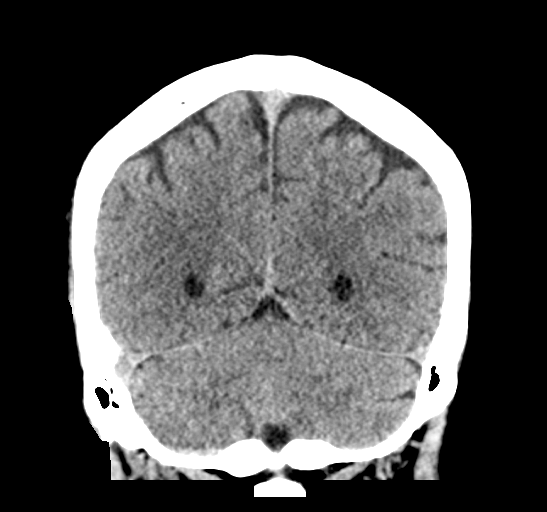
[im 33/75  brain]
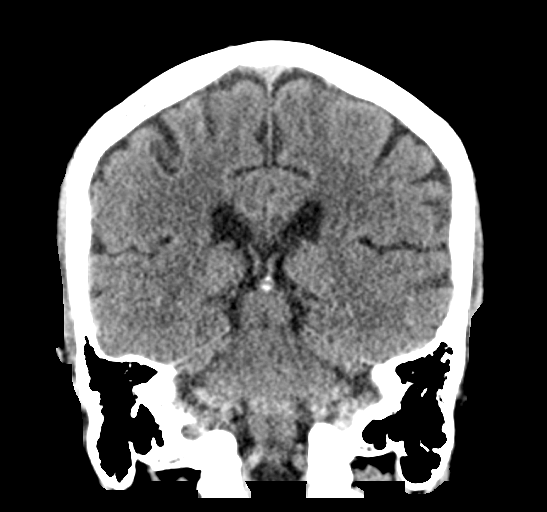
[im 42/75  brain]
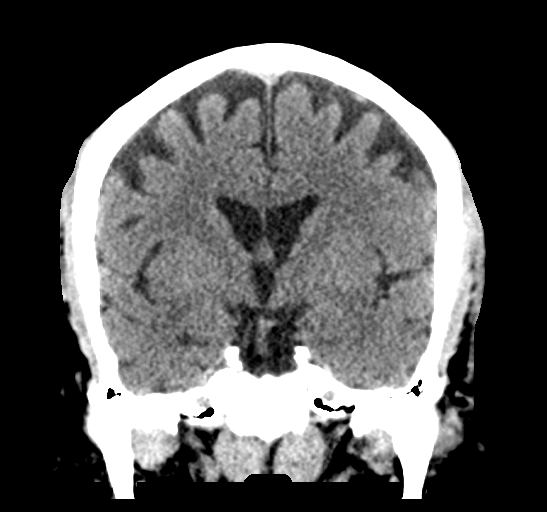

[Series 5: head 3.0 mpr sag · sagittal · 0.35mm/px · 3 of 61 slices shown]
[im 21/61  brain]
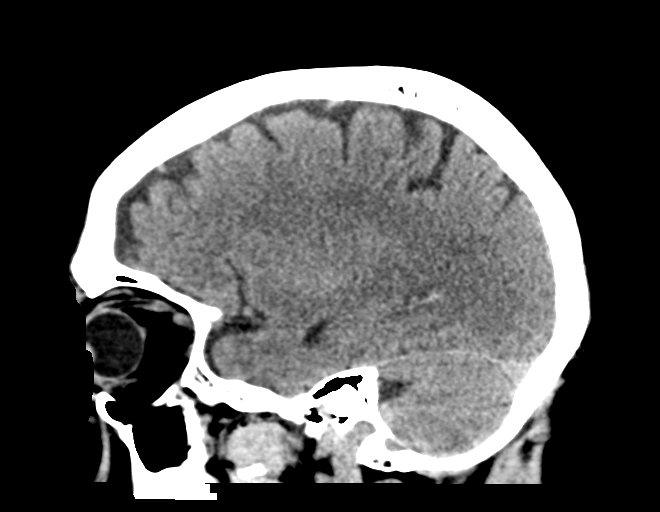
[im 31/61  brain]
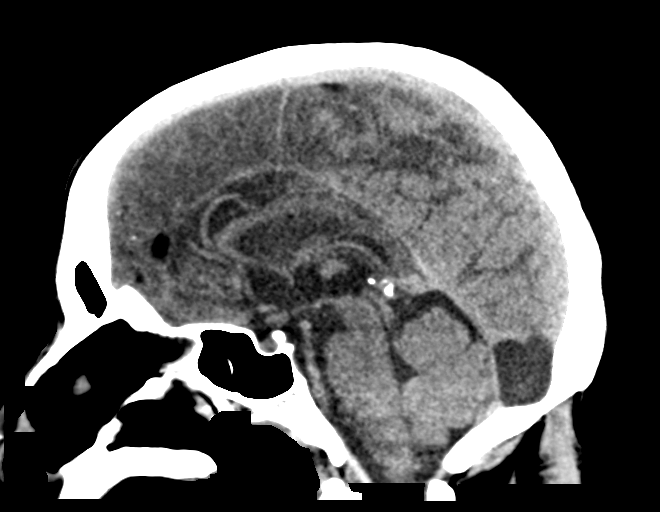
[im 41/61  brain]
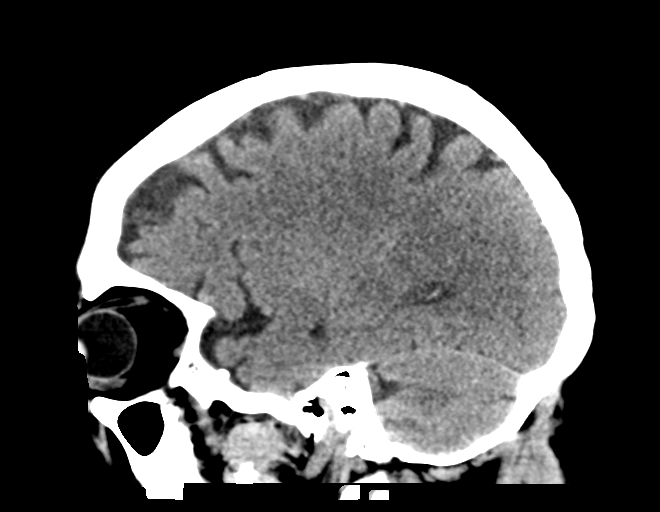

[16 of 47 positions shown; findings below may reference images not displayed]

FINDINGS: Brain: No evidence of acute infarction, hemorrhage, hydrocephalus,
extra-axial collection or mass lesion/mass effect. Mild atrophic
changes are noted.

Vascular: No hyperdense vessel or unexpected calcification.

Skull: Normal. Negative for fracture or focal lesion.

Sinuses/Orbits: No acute finding.

Other: None.
IMPRESSION: Mild atrophic changes are noted. No acute intracranial abnormality
is seen.

## 2020-01-24 DIAGNOSIS — F4310 Post-traumatic stress disorder, unspecified: Secondary | ICD-10-CM | POA: Diagnosis not present

## 2020-01-28 DIAGNOSIS — F4310 Post-traumatic stress disorder, unspecified: Secondary | ICD-10-CM | POA: Diagnosis not present

## 2020-01-31 DIAGNOSIS — F4310 Post-traumatic stress disorder, unspecified: Secondary | ICD-10-CM | POA: Diagnosis not present

## 2020-02-11 DIAGNOSIS — F4310 Post-traumatic stress disorder, unspecified: Secondary | ICD-10-CM | POA: Diagnosis not present

## 2020-02-14 DIAGNOSIS — F4310 Post-traumatic stress disorder, unspecified: Secondary | ICD-10-CM | POA: Diagnosis not present

## 2020-02-18 DIAGNOSIS — F4310 Post-traumatic stress disorder, unspecified: Secondary | ICD-10-CM | POA: Diagnosis not present

## 2020-02-21 DIAGNOSIS — F4310 Post-traumatic stress disorder, unspecified: Secondary | ICD-10-CM | POA: Diagnosis not present

## 2020-02-25 DIAGNOSIS — F4310 Post-traumatic stress disorder, unspecified: Secondary | ICD-10-CM | POA: Diagnosis not present

## 2020-02-28 DIAGNOSIS — F4310 Post-traumatic stress disorder, unspecified: Secondary | ICD-10-CM | POA: Diagnosis not present

## 2020-03-03 DIAGNOSIS — F4310 Post-traumatic stress disorder, unspecified: Secondary | ICD-10-CM | POA: Diagnosis not present

## 2020-03-06 DIAGNOSIS — F4310 Post-traumatic stress disorder, unspecified: Secondary | ICD-10-CM | POA: Diagnosis not present

## 2020-03-10 DIAGNOSIS — F4310 Post-traumatic stress disorder, unspecified: Secondary | ICD-10-CM | POA: Diagnosis not present

## 2020-03-17 DIAGNOSIS — F4310 Post-traumatic stress disorder, unspecified: Secondary | ICD-10-CM | POA: Diagnosis not present

## 2020-03-20 DIAGNOSIS — F4310 Post-traumatic stress disorder, unspecified: Secondary | ICD-10-CM | POA: Diagnosis not present

## 2020-03-26 DIAGNOSIS — F4310 Post-traumatic stress disorder, unspecified: Secondary | ICD-10-CM | POA: Diagnosis not present

## 2020-04-01 DIAGNOSIS — F4310 Post-traumatic stress disorder, unspecified: Secondary | ICD-10-CM | POA: Diagnosis not present

## 2020-04-04 DIAGNOSIS — F4310 Post-traumatic stress disorder, unspecified: Secondary | ICD-10-CM | POA: Diagnosis not present

## 2020-04-10 DIAGNOSIS — F4310 Post-traumatic stress disorder, unspecified: Secondary | ICD-10-CM | POA: Diagnosis not present

## 2020-04-14 DIAGNOSIS — F4310 Post-traumatic stress disorder, unspecified: Secondary | ICD-10-CM | POA: Diagnosis not present

## 2021-11-26 ENCOUNTER — Emergency Department (HOSPITAL_COMMUNITY)
Admission: EM | Admit: 2021-11-26 | Discharge: 2021-11-26 | Disposition: A | Discharge status: Home / Self Care | Payer: No Typology Code available for payment source | Attending: Emergency Medicine | Admitting: Emergency Medicine

## 2021-11-26 DIAGNOSIS — T402X1A Poisoning by other opioids, accidental (unintentional), initial encounter: Secondary | ICD-10-CM | POA: Diagnosis not present

## 2021-11-26 DIAGNOSIS — T50901A Poisoning by unspecified drugs, medicaments and biological substances, accidental (unintentional), initial encounter: Secondary | ICD-10-CM

## 2021-11-26 MED ORDER — NALOXONE HCL 4 MG/0.1ML NA LIQD: 1.0000 | Freq: Once | NASAL | 1 refills | Status: AC

## 2021-11-26 NOTE — ED Notes (Signed)
Rn reviewed discharge instructions with pt. Pt verbalized understanding and had no further questions. VSS upon discharge 

## 2021-11-26 NOTE — ED Provider Notes (Signed)
?[facility] EMERGENCY DEPARTMENT ?Provider Note ? ? ?CSN: [id number] ?Arrival date & time: 11/26/21  1914 ? ?  ? ?History ? ?Chief Complaint  ?Patient presents with  ? Drug Overdose  ? ? ?Jesse Galloway is a 61 y.o. male. ? ?HPI ?Patient reports that he was with a friend who came over and suggested that they take hit of crack.  Patient reports they did that and he felt fine.  He reports his friend then went out to get some more and when he returned they had another hit.  This time, the patient does not remember what happened after that.  Bystanders called EMS and patient was found to have apnea.  EMS was called and administered 2 mg of Narcan with the patient becoming alert.  Patient reports he is not sure if the last substance was mixed with either heroin or fentanyl.  Patient reports he felt fine before this occurred. ?  ? ?Home Medications ?Prior to Admission medications   ?Medication Sig Start Date End Date Taking? Authorizing Provider  ?naloxone [facility]) nasal spray 4 mg/0.1 mL Place 1 spray into the nose once for 1 dose. 11/26/21 11/26/21 Yes Arby Barrette, MD  ?ARIPiprazole (ABILIFY) 10 MG tablet Take 5 mg by mouth at bedtime.    [provider]  ?finasteride (PROSCAR) 5 MG tablet Take 5 mg by mouth daily.    [provider]  ?meloxicam (MOBIC) 15 MG tablet Take 15 mg by mouth daily.    [provider]  ?nortriptyline (PAMELOR) 25 MG capsule Take 50 mg by mouth at bedtime.    [provider]  ?oxybutynin (DITROPAN-XL) 10 MG 24 hr tablet Take 10 mg by mouth every evening.    [provider]  ?polyvinyl alcohol (LIQUIFILM TEARS) 1.4 % ophthalmic solution Place 1 drop into both eyes as needed for dry eyes.    [provider]  ?propranolol (INDERAL) 40 MG tablet Take 40 mg by mouth 2 (two) times daily.    [provider]  ?sertraline (ZOLOFT) 100 MG tablet Take 100 mg by mouth daily.    [provider]  ?sildenafil (VIAGRA)  100 MG tablet Take 100 mg by mouth daily as needed for erectile dysfunction.    [provider]  ?simvastatin (ZOCOR) 40 MG tablet Take 20 mg by mouth at bedtime.    [provider]  ?tamsulosin (FLOMAX) 0.4 MG CAPS capsule Take 0.4 mg by mouth 2 (two) times a day.    [provider]  ?   ? ?Allergies    ?Patient has no known allergies.   ? ?Review of Systems   ?Review of Systems ?10 systems reviewed negative except as per HPI ?Physical Exam ?Updated Vital Signs ?BP (!) 127/92   Pulse 74   Temp 97.7 ?F (36.5 ?C) (Oral)   Resp (!) 21   Ht 5\' 9"  (1.753 m)   Wt 88.5 kg   SpO2 95%   BMI 28.80 kg/m?  ?Physical Exam ?Constitutional:   ?   Comments: Patient resting quietly.  As I come in the room he awakens to light voice.  No respiratory distress  ?HENT:  ?   Head: Normocephalic and atraumatic.  ?   Mouth/Throat:  ?   Pharynx: Oropharynx is clear.  ?Eyes:  ?   Extraocular Movements: Extraocular movements intact.  ?Cardiovascular:  ?   Rate and Rhythm: Normal rate and regular rhythm.  ?Pulmonary:  ?   Effort: Pulmonary effort is normal.  ?  Breath sounds: Normal breath sounds.  ?Abdominal:  ?   General: There is no distension.  ?   Palpations: Abdomen is soft.  ?   Tenderness: There is no abdominal tenderness. There is no guarding.  ?Musculoskeletal:     ?   General: Normal range of motion.  ?   Right lower leg: No edema.  ?   Left lower leg: No edema.  ?Skin: ?   General: Skin is warm and dry.  ?Neurological:  ?   General: No focal deficit present.  ?   Mental Status: He is oriented to person, place, and time.  ?   Motor: No weakness.  ?   Coordination: Coordination normal.  ?Psychiatric:     ?   Mood and Affect: Mood normal.  ? ? ?ED Results / Procedures / Treatments   ?Labs ?(all labs ordered are listed, but only abnormal results are displayed) ?Labs Reviewed - No data to display ? ?EKG ?EKG Interpretation ? ?Date/Time:  Thursday Nov 26 2021 19:21:05 EDT ?Ventricular Rate:  80 ?PR  Interval:  162 ?QRS Duration: 106 ?QT Interval:  408 ?QTC Calculation: 471 ?R Axis:   3 ?Text Interpretation: Sinus rhythm Left ventricular hypertrophy Borderline T abnormalities, diffuse leads no acute ischemic appearance no significant change from previous. Confirmed by Arby Barrette [phone]) on 11/26/2021 11:50:01 PM ? ?Radiology ?No results found. ? ?Procedures ?Procedures  ? ? ?Medications Ordered in ED ?Medications - No data to display ? ?ED Course/ Medical Decision Making/ A&P ?  ?                        ?Medical Decision Making ?Risk ?Prescription drug management. ? ?Patient presents after accidental Opioid overdose.  Patient was using drugs recreationally.  He had no intention of purposeful self injury.  Patient responded well to Narcan administered by EMS.  Patient has been under continuous observation and not required any repeat dosing of Narcan.  I have done multiple rechecks and he awakens easily.  No respiratory depression.  No hypoxia.  Patient initially felt a little spacey but upon recheck feels completely back to normal.  At this time stable for discharge.  Social work has talked with the patient and offered resources and treatment program information. ? ? ? ? ? ? ? ? ?Final Clinical Impression(s) / ED Diagnoses ?Final diagnoses:  ?Accidental overdose, initial encounter  ? ? ?Rx / DC Orders ?ED Discharge Orders   ? ?      Ordered  ?  naloxone (NARCAN) nasal spray 4 mg/0.1 mL   Once       ? 11/26/21 2328  ? ?  ?  ? ?  ? ? ?  ?Arby Barrette, MD ?11/26/21 2353 ? ?

## 2021-11-26 NOTE — ED Notes (Addendum)
This RN attempted to call pts brother x2 with no answer. RN then attempted to call pts sister with no answer ?

## 2021-11-26 NOTE — Discharge Instructions (Signed)
1.  You had an overdose that almost killed you.  Use the resource guide to find treatment. ?2.  You have been given a prescription for Narcan.  Keep this on hand at all times if you are using opioid medications. ?

## 2021-11-26 NOTE — Social Work (Signed)
CSW met with Pt at bedside. Pt endorses smoking cocaine and then blacking out. Pt states intention to stop using but declined resources from CSW stating that he already has resources. ?[facility] 2 ?

## 2021-11-26 NOTE — ED Notes (Signed)
Shari Heritage (Sister) call to ask when there is info to share about Maikol to call her at [phone]. Also, Cadden Elizondo [facility]) is more of the caregiver. ?

## 2021-11-26 NOTE — ED Triage Notes (Signed)
Pt bib [facility] after overdose. Pt "took a hit" of unknown substance, EMS reported apneic respirations, gave 2mg  narcan, pt became more alert. Pt currently alert, respirations even and unlabored. ?

## 2023-03-30 ENCOUNTER — Emergency Department (HOSPITAL_COMMUNITY): Payer: No Typology Code available for payment source

## 2023-03-30 ENCOUNTER — Emergency Department (HOSPITAL_COMMUNITY)
Admission: EM | Admit: 2023-03-30 | Discharge: 2023-03-30 | Disposition: A | Discharge status: Home / Self Care | Payer: No Typology Code available for payment source | Attending: Emergency Medicine | Admitting: Emergency Medicine

## 2023-03-30 ENCOUNTER — Other Ambulatory Visit: Payer: Self-pay

## 2023-03-30 ENCOUNTER — Encounter (HOSPITAL_COMMUNITY): Payer: Self-pay

## 2023-03-30 DIAGNOSIS — D72829 Elevated white blood cell count, unspecified: Secondary | ICD-10-CM | POA: Diagnosis not present

## 2023-03-30 DIAGNOSIS — R7989 Other specified abnormal findings of blood chemistry: Secondary | ICD-10-CM | POA: Insufficient documentation

## 2023-03-30 DIAGNOSIS — M1990 Unspecified osteoarthritis, unspecified site: Secondary | ICD-10-CM

## 2023-03-30 DIAGNOSIS — M25551 Pain in right hip: Secondary | ICD-10-CM | POA: Insufficient documentation

## 2023-03-30 DIAGNOSIS — D649 Anemia, unspecified: Secondary | ICD-10-CM | POA: Insufficient documentation

## 2023-03-30 DIAGNOSIS — M25552 Pain in left hip: Secondary | ICD-10-CM | POA: Diagnosis not present

## 2023-03-30 LAB — CBC WITH DIFFERENTIAL/PLATELET
Abs Immature Granulocytes: 0.04 10*3/uL (ref 0.00–0.07)
Basophils Absolute: 0 10*3/uL (ref 0.0–0.1)
Basophils Relative: 0 %
Eosinophils Absolute: 0.1 10*3/uL (ref 0.0–0.5)
Eosinophils Relative: 1 %
HCT: 39.8 % (ref 39.0–52.0)
Hemoglobin: 12.8 g/dL — ABNORMAL LOW (ref 13.0–17.0)
Immature Granulocytes: 0 %
Lymphocytes Relative: 15 %
Lymphs Abs: 1.6 10*3/uL (ref 0.7–4.0)
MCH: 30.7 pg (ref 26.0–34.0)
MCHC: 32.2 g/dL (ref 30.0–36.0)
MCV: 95.4 fL (ref 80.0–100.0)
Monocytes Absolute: 0.8 10*3/uL (ref 0.1–1.0)
Monocytes Relative: 7 %
Neutro Abs: 8.3 10*3/uL — ABNORMAL HIGH (ref 1.7–7.7)
Neutrophils Relative %: 77 %
Platelets: 343 10*3/uL (ref 150–400)
RBC: 4.17 MIL/uL — ABNORMAL LOW (ref 4.22–5.81)
RDW: 13.3 % (ref 11.5–15.5)
WBC: 10.8 10*3/uL — ABNORMAL HIGH (ref 4.0–10.5)
nRBC: 0 % (ref 0.0–0.2)

## 2023-03-30 LAB — COMPREHENSIVE METABOLIC PANEL
ALT: 16 U/L (ref 0–44)
AST: 22 U/L (ref 15–41)
Albumin: 3.7 g/dL (ref 3.5–5.0)
Alkaline Phosphatase: 68 U/L (ref 38–126)
Anion gap: 11 (ref 5–15)
BUN: 24 mg/dL — ABNORMAL HIGH (ref 8–23)
CO2: 20 mmol/L — ABNORMAL LOW (ref 22–32)
Calcium: 9.1 mg/dL (ref 8.9–10.3)
Chloride: 104 mmol/L (ref 98–111)
Creatinine, Ser: 1.25 mg/dL — ABNORMAL HIGH (ref 0.61–1.24)
GFR, Estimated: >60 mL/min (ref >60)
Glucose, Bld: 89 mg/dL (ref 70–99)
Potassium: 4.2 mmol/L (ref 3.5–5.1)
Sodium: 135 mmol/L (ref 135–145)
Total Bilirubin: 0.6 mg/dL (ref 0.3–1.2)
Total Protein: 6.9 g/dL (ref 6.5–8.1)

## 2023-03-30 MED ORDER — OXYCODONE-ACETAMINOPHEN 5-325 MG PO TABS
1.0000 | ORAL_TABLET | Freq: Once | ORAL | Status: AC
  Administered 2023-03-30: 1 via ORAL
  Filled 2023-03-30: qty 1

## 2023-03-30 NOTE — ED Provider Notes (Signed)
[place] EMERGENCY DEPARTMENT AT [facility] Provider Note   CSN: [id number] Arrival date & time: 03/30/23  1808     History  Chief Complaint  Patient presents with   Hip Pain    Jesse Galloway is a 62 y.o. male with PMHx arthritis who presents to ED concerned for bilateral hip pain. Patient describes 8/10 severity at baseline, but states that pain is now 10/10 and has been progressing since his brother passed away 2 weeks ago. Patient denying any recent trauma. Last dose of pain medication was Meloxicam and Tylenol at 8AM this morning.  Denies fever, chest pain, dyspnea, cough, nausea, vomiting, diarrhea. Denies immunocompromise/IVDU.     Hip Pain       Home Medications Prior to Admission medications   Medication Sig Start Date End Date Taking? Authorizing Provider  ARIPiprazole (ABILIFY) 10 MG tablet Take 5 mg by mouth at bedtime.    [provider]  finasteride (PROSCAR) 5 MG tablet Take 5 mg by mouth daily.    [provider]  meloxicam (MOBIC) 15 MG tablet Take 15 mg by mouth daily.    [provider]  nortriptyline (PAMELOR) 25 MG capsule Take 50 mg by mouth at bedtime.    [provider]  oxybutynin (DITROPAN-XL) 10 MG 24 hr tablet Take 10 mg by mouth every evening.    [provider]  polyvinyl alcohol (LIQUIFILM TEARS) 1.4 % ophthalmic solution Place 1 drop into both eyes as needed for dry eyes.    [provider]  propranolol (INDERAL) 40 MG tablet Take 40 mg by mouth 2 (two) times daily.    [provider]  sertraline (ZOLOFT) 100 MG tablet Take 100 mg by mouth daily.    [provider]  sildenafil (VIAGRA) 100 MG tablet Take 100 mg by mouth daily as needed for erectile dysfunction.    [provider]  simvastatin (ZOCOR) 40 MG tablet Take 20 mg by mouth at bedtime.    [provider]  tamsulosin (FLOMAX) 0.4 MG CAPS capsule Take 0.4 mg by mouth 2 (two) times a  day.    [provider]      Allergies    Patient has no known allergies.    Review of Systems   Review of Systems  Musculoskeletal:        Hip pain    Physical Exam Updated Vital Signs BP 113/81 (BP Location: Right Arm)   Pulse 60   Temp 98 F (36.7 C) (Oral)   Resp 19   Ht 5\' 9"  (1.753 m)   Wt 89 kg   SpO2 96%   BMI 28.98 kg/m  Physical Exam Vitals and nursing note reviewed.  Constitutional:      General: He is not in acute distress.    Appearance: He is not ill-appearing or toxic-appearing.  HENT:     Head: Normocephalic and atraumatic.  Eyes:     General: No scleral icterus.       Right eye: No discharge.        Left eye: No discharge.     Conjunctiva/sclera: Conjunctivae normal.  Cardiovascular:     Rate and Rhythm: Normal rate.  Pulmonary:     Effort: Pulmonary effort is normal.  Abdominal:     General: Abdomen is flat.  Musculoskeletal:     Comments: Passive ROM intact of BL hips. +2 pedal pulses. Sensation to light touch intact BL. No tenderness to palpation of lateral hips. No leg  swelling appreciated.  Skin:    General: Skin is warm and dry.  Neurological:     General: No focal deficit present.     Mental Status: He is alert. Mental status is at baseline.  Psychiatric:        Mood and Affect: Mood normal.        Behavior: Behavior normal.     ED Results / Procedures / Treatments   Labs (all labs ordered are listed, but only abnormal results are displayed) Labs Reviewed  CBC WITH DIFFERENTIAL/PLATELET - Abnormal; Notable for the following components:      Result Value   WBC 10.8 (*)    RBC 4.17 (*)    Hemoglobin 12.8 (*)    Neutro Abs 8.3 (*)    All other components within normal limits  COMPREHENSIVE METABOLIC PANEL - Abnormal; Notable for the following components:   CO2 20 (*)    BUN 24 (*)    Creatinine, Ser 1.25 (*)    All other components within normal limits    EKG None  Radiology DG Hip Unilat  With Pelvis 2-3 Views  Right  Result Date: 03/30/2023 CLINICAL DATA:  Bilateral hip pain, acute on chronic. Difficulty walking. History of arthritis. EXAM: DG HIP (WITH OR WITHOUT PELVIS) 2-3V RIGHT; DG HIP (WITH OR WITHOUT PELVIS) 2-3V LEFT COMPARISON:  None Available. FINDINGS: Degenerative changes in the lower lumbar spine and hips. Degenerative changes in the hips are symmetrical with some narrowing and sclerosis of the superior acetabular joint and small osteophyte formation. Pelvis appears intact. SI joints and symphysis pubis are not displaced. Visualized sacrum appears intact. No evidence of acute fracture or dislocation involving the right or left hip. Soft tissues are unremarkable. 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 No acute bony abnormalities. Electronically Signed   By: Burman Nieves M.D.   On: 03/30/2023 19:27   DG Hip Unilat With Pelvis 2-3 Views Left  Result Date: 03/30/2023 CLINICAL DATA:  Bilateral hip pain, acute on chronic. Difficulty walking. History of arthritis. EXAM: DG HIP (WITH OR WITHOUT PELVIS) 2-3V RIGHT; DG HIP (WITH OR WITHOUT PELVIS) 2-3V LEFT COMPARISON:  None Available. FINDINGS: Degenerative changes in the lower lumbar spine and hips. Degenerative changes in the hips are symmetrical with some narrowing and sclerosis of the superior acetabular joint and small osteophyte formation. Pelvis appears intact. SI joints and symphysis pubis are not displaced. Visualized sacrum appears intact. No evidence of acute fracture or dislocation involving the right or left hip. Soft tissues are unremarkable. 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 No acute bony abnormalities. Electronically Signed   By: Burman Nieves M.D.   On: 03/30/2023 19:27    Procedures Procedures    Medications Ordered in ED Medications  oxyCODONE-acetaminophen (PERCOCET/ROXICET) 5-325 MG per tablet 1 tablet (1 tablet Oral Given 03/30/23 2128)    ED Course/ Medical Decision Making/ A&P                                  Medical Decision Making Amount and/or Complexity of Data Reviewed Labs: ordered. Radiology: ordered.  Risk Prescription drug management.   This patient presents to the ED for concern of hip pain, this involves an extensive number of treatment options, and is a complaint that carries with it a high risk of complications and morbidity.  The differential diagnosis includes hemarthrosis, gout, septic joint, fracture, bursitis, compartment syndrome   Co morbidities that  complicate the patient evaluation  arthritis   Lab Tests:  I Ordered, and personally interpreted labs.  The pertinent results include:  -CBC: mild leukocytosis (10.8); mild anemia (12.8) -CMP: BUN/Cr mildly elevated (24/1.25)   Imaging Studies ordered:  I ordered imaging studies including  - pelvis xray: to assess for process contributing to patient's symptoms I independently visualized and interpreted imaging Shared findings with patient I agree with the radiologist interpretation   Problem List / ED Course / Critical interventions / Medication management  Patient presents to ED concerned for bilateral hip pain.  Denies back pain. This pain is severe chronically but has increased in severity over the past 2 weeks after patient's brother passed away.  Patient stating that it is now hard for him to walk due to the pain.  Patient stating that his last dose of pain medicine was over 12 hours ago with a meloxicam and Tylenol at 8 AM.  I have provided patient pain management here in the emergency room which helped relieve his pain. Patient stating that acupuncture helped with this pain in the past but he has not had acupuncture recently. Denying any alarming symptoms such as saddle paresthesias, recent trauma, immunocompromise/IVDU. Physical exam with pain with active ROM.  Passive ROM within normal limits.  Rest of physical exam reassuring with +2 pedal pulses, sensation to light touch intact, brisk  capillary refill of toes.  No swelling appreciated of bilateral legs.  Patient is afebrile with stable vitals.  X-rays showing mild arthritis. Educated patient to follow-up with his PCP and possibly orthopedics.  Provided patient with number for orthopedics.  Patient verbalized understanding of plan. I have reviewed the patients home medicines and have made adjustments as needed Patient afebrile with stable vitals.  Provided return precautions.  Discharged in good condition.   Ddx these are considered less likely due to history of present illness and physical exam -hemarthrosis: joint without swelling; ROM intact -gout: no warmth or erythema; ROM intact  -septic joint: afebrile; no warmth or erythema; no skin changes; ROM intact  -fracture: xray without concern  -bursitis: no tenderness of lateral hips -compartment syndrome: area not tense; neurovascularly intact   Social Determinants of Health:  none          Final Clinical Impression(s) / ED Diagnoses Final diagnoses:  Arthritis    Rx / DC Orders ED Discharge Orders     None         Margarita Rana 03/30/23 2152    Gwyneth Sprout, MD 03/30/23 2310

## 2023-03-30 NOTE — ED Notes (Signed)
Family updated as to patient's status. His sister Ulyess Blossom is picking him up she will call when she arrives

## 2023-03-30 NOTE — ED Triage Notes (Signed)
Pt coming in with bilateral hip pain, thaat is acute on chronic pain. He states over the last week he has had progressing hip pain that has caused him to be unable to walk well (he does use a cane to walk ). Also is reporting, bilateral foot swelling as swell and a sharp shooting pain that radiates form his right hip down his leg. He has no other complaints at this time.

## 2023-03-30 NOTE — Discharge Instructions (Signed)
It was a pleasure caring for you today.  Workup is reassuring.  I recommend following up with primary care.  I have also provided information for orthopedic doctor in this discharge paperwork that you can reach out to in the morning.  Please seek emergency care if experiencing any new or worsening symptoms.
